# Patient Record
Sex: Male | Born: 1990 | Race: White | Hispanic: No | Smoking: Former smoker
Health system: Southern US, Community
[De-identification: ages and names within clinical notes are randomized; demographics above are authoritative.]

## PROBLEM LIST (undated history)

## (undated) DIAGNOSIS — B019 Varicella without complication: Secondary | ICD-10-CM

## (undated) DIAGNOSIS — J189 Pneumonia, unspecified organism: Secondary | ICD-10-CM

## (undated) DIAGNOSIS — E785 Hyperlipidemia, unspecified: Secondary | ICD-10-CM

## (undated) DIAGNOSIS — K219 Gastro-esophageal reflux disease without esophagitis: Secondary | ICD-10-CM

## (undated) DIAGNOSIS — F419 Anxiety disorder, unspecified: Secondary | ICD-10-CM

## (undated) HISTORY — DX: Anxiety disorder, unspecified: F41.9

## (undated) HISTORY — DX: Gastro-esophageal reflux disease without esophagitis: K21.9

## (undated) HISTORY — DX: Hyperlipidemia, unspecified: E78.5

## (undated) HISTORY — DX: Varicella without complication: B01.9

## (undated) HISTORY — DX: Pneumonia, unspecified organism: J18.9

---
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
## 2015-01-20 ENCOUNTER — Emergency Department: Payer: Self-pay | Admitting: Emergency Medicine

## 2015-07-18 LAB — LIPID PANEL
CHOLESTEROL: 158 mg/dL (ref 0–200)
HDL: 28 mg/dL — AB (ref 35–70)
LDL Cholesterol: 75 mg/dL
TRIGLYCERIDES: 276 mg/dL — AB (ref 40–160)

## 2015-07-18 LAB — HEMOGLOBIN A1C: Hgb A1c MFr Bld: 5.6 % (ref 4.0–6.0)

## 2015-09-04 ENCOUNTER — Ambulatory Visit (INDEPENDENT_AMBULATORY_CARE_PROVIDER_SITE_OTHER): Payer: 59 | Admitting: Family Medicine

## 2015-09-04 ENCOUNTER — Encounter: Payer: Self-pay | Admitting: Family Medicine

## 2015-09-04 VITALS — BP 124/68 | HR 83 | Temp 98.6°F | Ht 70.87 in | Wt 218.2 lb

## 2015-09-04 DIAGNOSIS — E669 Obesity, unspecified: Secondary | ICD-10-CM

## 2015-09-04 DIAGNOSIS — F419 Anxiety disorder, unspecified: Secondary | ICD-10-CM | POA: Diagnosis not present

## 2015-09-04 DIAGNOSIS — Z87891 Personal history of nicotine dependence: Secondary | ICD-10-CM | POA: Insufficient documentation

## 2015-09-04 DIAGNOSIS — E663 Overweight: Secondary | ICD-10-CM | POA: Insufficient documentation

## 2015-09-04 DIAGNOSIS — Z720 Tobacco use: Secondary | ICD-10-CM

## 2015-09-04 NOTE — Progress Notes (Signed)
Patient ID: Billy Acosta, male   DOB: [birth year], 24 y.o.   MRN: [id number]  [name], MD Phone: [phone]  [name] is a 24 y.o. male who presents today for new patient visit.  Obesity: Patient notes he would like to lose weight. Per his work he needs to get below a BMI of 30. He is exercising consistently in the past. States he'll do well for short periods of time and then slack off. He is not exercising at all right now. His diet is poor. SHE does not eat breakfast. He eats lunch out and typically eats [place] or [place] food. Does not eat any snacks during the day. For dinner he has meat and vegetables.  Tobacco abuse: Started smoking 6-7 years ago. Is currently gotten down to 1-2 cigarettes a day. He notes this helps relieve stress. He only smokes after work when he gets home. Since he gets home and goes outside to help him relax. Feels like he is more addicted to the habit of going outside and smoking as opposed to the nicotine itself. He has quit in the past. He has used cigarettes in the past. Tried nicotine gum with no benefit. Notes he does not smoke in the car or around his children.  Anxiety: Patient notes he wants to transfer care from psychiatrist office. He is followed by a counselor as well. He notes he enjoys seeing his counselor and feels counselors helpful. Feels psychiatrist was not helpful. Has been on Zoloft for the last year. He notes this helped. Most of his anxiety is social anxiety. Feels anxious when he goes out of the house. Notes he feels anxious when asked about himself. Has been depressed in the past. Last time was about a year ago. This following separation from his wife. He had some mild SI at that time though no intent or plan. He has no SI or HI now. Additionally he has been on Wellbutrin in the past and this was not helpful.  Active Ambulatory Problems    Diagnosis Date Noted  . Obesity 09/04/2015  . Tobacco abuse 09/04/2015  . Anxiety  09/04/2015   Resolved Ambulatory Problems    Diagnosis Date Noted  . No Resolved Ambulatory Problems   No Additional Past Medical History    No family history on file.  Social History   Social History  . Marital Status: Unknown    Spouse Name: N/A  . Number of Children: N/A  . Years of Education: N/A   Occupational History  . Not on file.   Social History Main Topics  . Smoking status: Current Every Day Smoker  . Smokeless tobacco: Not on file  . Alcohol Use: 0.0 oz/week    0 Standard drinks or equivalent per week  . Drug Use: No  . Sexual Activity: Not on file   Other Topics Concern  . Not on file   Social History Narrative  . No narrative on file    ROS   General:  Negative for unexplained weight loss, fever Skin: Negative for new or changing mole, sore that won't heal HEENT: Negative for trouble hearing, trouble seeing, ringing in ears, mouth sores, hoarseness, change in voice, dysphagia. CV:  Negative for chest pain, dyspnea, edema, palpitations Resp: Negative for cough, dyspnea, hemoptysis GI: Negative for nausea, vomiting, diarrhea, constipation, abdominal pain, melena, hematochezia. GU: Negative for dysuria, incontinence, urinary hesitance, hematuria, vaginal or penile discharge, polyuria, sexual difficulty, lumps in testicle or breasts MSK: Negative for muscle cramps  or aches, joint pain or swelling Neuro: Negative for headaches, weakness, numbness, dizziness, passing out/fainting Psych: Positive for anxiety, Negative for depression, memory problems  Objective  Physical Exam Filed Vitals:   09/04/15 0759  BP: 124/68  Pulse: 83  Temp: 98.6 F (37 C)   Physical Exam  Constitutional: He is well-developed, well-nourished, and in no distress.  HENT:  Head: Normocephalic and atraumatic.  Right Ear: External ear normal.  Left Ear: External ear normal.  Mouth/Throat: Oropharynx is clear and moist. No oropharyngeal exudate.  Eyes: Conjunctivae are  normal. Pupils are equal, round, and reactive to light.  Neck: Neck supple.  Cardiovascular: Normal rate, regular rhythm and normal heart sounds.  Exam reveals no gallop and no friction rub.   No murmur heard. Pulmonary/Chest: Effort normal and breath sounds normal. No respiratory distress. He has no wheezes. He has no rales.  Abdominal: Soft. Bowel sounds are normal. He exhibits no distension. There is no tenderness. There is no rebound and no guarding.  Musculoskeletal: He exhibits no edema.  Lymphadenopathy:    He has no cervical adenopathy.  Neurological: He is alert. Gait normal.  Skin: Skin is warm and dry. He is not diaphoretic.  Psychiatric:  Mood anxious, affect anxious     Assessment/Plan:   Obesity Discussed diet and exercise at length. Patient will start exercising 2-3 days a week. Patient is given dietary instructions and recommendations. We'll see back in a month to see how he is progressed with this.  Tobacco abuse The patient would like tobacco abuse 1-2 cigarettes per day. Discussed options for quitting including nicotine replacement versus habit replacement. Patient opted for habit replacement. Plan of patient going outside to play with his children when he gets home to try to replace the habit of smoking when he goes outside once he gets home. Patient feels this will help him relax once he gets home. He will let us know if this is not beneficial. Patient could benefit from nicotine lozenges in the future if he desires nicotine replacement.  Anxiety Patient opting to transfer care from psychiatrist our office for management of his anxiety medications. States he is stable on Zoloft. We will continue this medication at this time and then request records from his psychiatrist's office. GAD 7 score of 9 with somewhat difficult. PHQ 9 score of 12 with somewhat difficult. Patient will continue to follow up with his counselor as well. No SI or HI at this time. Given return  precautions.   Marikay AlarEric Aydee Mcnew

## 2015-09-04 NOTE — Progress Notes (Signed)
Pre visit review using our clinic review tool, if applicable. No additional management support is needed unless otherwise documented below in the visit note. 

## 2015-09-04 NOTE — Assessment & Plan Note (Signed)
The patient would like tobacco abuse 1-2 cigarettes per day. Discussed options for quitting including nicotine replacement versus habit replacement. Patient opted for habit replacement. Plan of patient going outside to play with his children when he gets home to try to replace the habit of smoking when he goes outside once he gets home. Patient feels this will help him relax once he gets home. He will let us know if this is not beneficial. Patient could benefit from nicotine lozenges in the future if he desires nicotine replacement.

## 2015-09-04 NOTE — Patient Instructions (Addendum)
Nice to meet you. Please add diet changes as outline below.  Please start exercising 2-3 days a week for 30 minutes at a time.  Please attempt to go outside with your kids when you get home to help deter you from smoking.  If you develop thoughts of hurting yourself or others please seek medical attention.   Diet Recommendations  Starchy (carb) foods: Bread, rice, pasta, potatoes, corn, cereal, grits, crackers, bagels, muffins, all baked goods.  (Fruits, milk, and yogurt also have carbohydrate, but most of these foods will not spike your blood sugar as the starchy foods will.)  A few fruits do cause high blood sugars; use small portions of bananas (limit to 1/2 at a time), grapes, watermelon, oranges, and most tropical fruits.    Protein foods: Meat, fish, poultry, eggs, dairy foods, and beans such as pinto and kidney beans (beans also provide carbohydrate).   1. Eat at least 3 meals and 1-2 snacks per day. Never go more than 4-5 hours while awake without eating. Eat breakfast within the first hour of getting up.   2. Limit starchy foods to TWO per meal and ONE per snack. ONE portion of a starchy  food is equal to the following:   - ONE slice of bread (or its equivalent, such as half of a hamburger bun).   - 1/2 cup of a "scoopable" starchy food such as potatoes or rice.   - 15 grams of carbohydrate as shown on food label.  3. Include at every meal: a protein food, a carb food, and vegetables and/or fruit.   - Obtain twice the volume of veg's as protein or carbohydrate foods for both lunch and dinner.   - Fresh or frozen veg's are best.   - Keep frozen veg's on hand for a quick vegetable serving.

## 2015-09-04 NOTE — Assessment & Plan Note (Signed)
Discussed diet and exercise at length. Patient will start exercising 2-3 days a week. Patient is given dietary instructions and recommendations. We'll see back in a month to see how he is progressed with this.

## 2015-09-04 NOTE — Assessment & Plan Note (Signed)
Patient opting to transfer care from psychiatrist our office for management of his anxiety medications. States he is stable on Zoloft. We will continue this medication at this time and then request records from his psychiatrist's office. GAD 7 score of 9 with somewhat difficult. PHQ 9 score of 12 with somewhat difficult. Patient will continue to follow up with his counselor as well. No SI or HI at this time. Given return precautions.

## 2015-10-05 ENCOUNTER — Ambulatory Visit (INDEPENDENT_AMBULATORY_CARE_PROVIDER_SITE_OTHER): Payer: 59 | Admitting: Family Medicine

## 2015-10-05 ENCOUNTER — Encounter: Payer: Self-pay | Admitting: Family Medicine

## 2015-10-05 VITALS — BP 108/70 | HR 66 | Temp 98.1°F | Ht 70.87 in | Wt 221.0 lb

## 2015-10-05 DIAGNOSIS — F419 Anxiety disorder, unspecified: Secondary | ICD-10-CM

## 2015-10-05 DIAGNOSIS — E781 Pure hyperglyceridemia: Secondary | ICD-10-CM | POA: Diagnosis not present

## 2015-10-05 DIAGNOSIS — E669 Obesity, unspecified: Secondary | ICD-10-CM

## 2015-10-05 DIAGNOSIS — Z720 Tobacco use: Secondary | ICD-10-CM

## 2015-10-05 MED ORDER — SERTRALINE HCL 100 MG PO TABS: 100.0000 mg | ORAL_TABLET | Freq: Every day | ORAL | Status: DC

## 2015-10-05 NOTE — Assessment & Plan Note (Signed)
Noted on patient's recent health screen to have triglycerides of 276. Discussed treatment options including diet and exercise versus medication management. Patient prefers to start with diet and exercise. He will work on these as outlined in the obesity section. He is given return precautions. We'll plan to recheck this in 2 months.

## 2015-10-05 NOTE — Patient Instructions (Signed)
Nice to see you. Please start exercising twice a week. She can do whatever exercise she would like. Please take her lunch to work one day a week. Have refilled her Zoloft. If you have any thoughts of depression, worsening anxiety, thoughts of harming yourself or others, or develop chest pain or shortness of breath please seek medical attention immediately.

## 2015-10-05 NOTE — Assessment & Plan Note (Signed)
Has not made any strides regarding exercise. I congratulated him on adding breakfast. We discussed further diet and exercise changes and patient will add 2 days of exercise each week and take his lunch to work one day a week. He will return in 2 months for follow-up on this issue.

## 2015-10-05 NOTE — Assessment & Plan Note (Signed)
Stable. We'll refill his Zoloft today. He'll continue to follow with his counselor. Given return precautions.

## 2015-10-05 NOTE — Progress Notes (Signed)
Patient ID: Billy Acosta, male   DOB: 1991/09/20, 24 y.o.   MRN: [id number]  Billy AlarEric Sonnenberg, MD Phone: [phone]  Billy Acosta is a 24 y.o. male who presents today for follow-up.  Obesity: Patient notes he has not started exercising at all. He does note he has started eating breakfast. He typically will have a protein bar, oatmeal, or cereal. He is still eating lunch out and typically eats [place] or [place] food. He does eat dinner and has a meat and vegetables with this. He does not eat many pastas. He does not drink any sodas or sweet tea. He notes he eats out during the week as a way to get out of the office.  Tobacco abuse: Patient notes he has almost quit smoking. He notes the last time he smoked was over the weekend when he had a couple cigarettes. Prior to that he did not smoke for a week. He is not smoking at all after work now. He does not desire nicotine replacement to help with this.  Anxiety: He has a baseline feeling of anxiety most of the time. It is worse in public places. He notes intermittently feeling depressed, though this is not consistent. He denies SI and HI. He notes the Zoloft has helped significantly since he has started this. He follows with a counselor as well. He notes his anxiety is stable. PHQ 9 score 12 somewhat difficult GAD 7 score 9 somewhat difficult  Hypertriglyceridemia: Patient notes he had a health screening in the end of August revealed triglyceride level of 276. His total cholesterol was 158, his LDL was 75, his HDL was 28, his A1c was 5.6. He denies chest pain and shortness of breath. Has not been on medication for this previously.  PMH: Current smoker, in the process of quitting  ROS see history of present illness  Objective  Physical Exam Filed Vitals:   10/05/15 1356  BP: 108/70  Pulse: 66  Temp: 98.1 F (36.7 C)   Physical Exam  Constitutional: He is well-developed, well-nourished, and in no distress.  HENT:  Head:  Normocephalic and atraumatic.  Cardiovascular: Normal rate, regular rhythm and normal heart sounds.  Exam reveals no gallop and no friction rub.   No murmur heard. Pulmonary/Chest: Effort normal and breath sounds normal. No respiratory distress. He has no wheezes. He has no rales.  Neurological: He is alert. Gait normal.  Skin: Skin is warm and dry. He is not diaphoretic.  Psychiatric:  Mood anxious, affect mildly anxious     Assessment/Plan: Please see individual problem list.  Anxiety Stable. We'll refill his Zoloft today. He'll continue to follow with his counselor. Given return precautions.  Obesity Has not made any strides regarding exercise. I congratulated him on adding breakfast. We discussed further diet and exercise changes and patient will add 2 days of exercise each week and take his lunch to work one day a week. He will return in 2 months for follow-up on this issue.  Tobacco abuse Last cigarette was this past weekend. Congratulated him on not smoking for the past 5 days. He'll continue to work on this. Offered assistance in this, though he prefers to do this on his own.  Hypertriglyceridemia Noted on patient's recent health screen to have triglycerides of 276. Discussed treatment options including diet and exercise versus medication management. Patient prefers to start with diet and exercise. He will work on these as outlined in the obesity section. He is given return precautions. We'll plan to recheck  this in 2 months.    Meds ordered this encounter  Medications  . sertraline (ZOLOFT) 100 MG tablet    Sig: Take 1 tablet (100 mg total) by mouth daily.    Dispense:  90 tablet    Refill:  1    Billy Acosta

## 2015-10-05 NOTE — Assessment & Plan Note (Signed)
Last cigarette was this past weekend. Congratulated him on not smoking for the past 5 days. He'll continue to work on this. Offered assistance in this, though he prefers to do this on his own.

## 2015-10-05 NOTE — Progress Notes (Signed)
Pre visit review using our clinic review tool, if applicable. No additional management support is needed unless otherwise documented below in the visit note. 

## 2015-12-05 ENCOUNTER — Ambulatory Visit: Payer: Self-pay | Admitting: Family Medicine

## 2016-03-29 ENCOUNTER — Other Ambulatory Visit: Payer: Self-pay | Admitting: Family Medicine

## 2016-03-31 NOTE — Telephone Encounter (Signed)
Please advise on refill. Patient was a no show for last appointment

## 2016-03-31 NOTE — Telephone Encounter (Signed)
Refill given. Patient needs follow-up appointment for further refills. 

## 2016-06-19 ENCOUNTER — Other Ambulatory Visit: Payer: Self-pay | Admitting: Family Medicine

## 2016-06-20 NOTE — Telephone Encounter (Signed)
Refill sent to pharmacy. Patient needs follow-up appointment scheduled.

## 2016-06-20 NOTE — Telephone Encounter (Signed)
Can we refill this? Patient No showed last appointment

## 2016-06-26 ENCOUNTER — Ambulatory Visit (INDEPENDENT_AMBULATORY_CARE_PROVIDER_SITE_OTHER): Payer: 59 | Admitting: Family Medicine

## 2016-06-26 DIAGNOSIS — R41840 Attention and concentration deficit: Secondary | ICD-10-CM | POA: Diagnosis not present

## 2016-06-26 NOTE — Progress Notes (Signed)
Pre visit review using our clinic review tool, if applicable. No additional management support is needed unless otherwise documented below in the visit note. 

## 2016-06-26 NOTE — Progress Notes (Signed)
  Billy AlarEric Deannah Rossi, MD Phone: [phone]  Billy Acosta is a 25 y.o. male who presents today for follow-up.  Patient reports in the past he has been diagnosed with ADHD. This occurred when he was high school and states he saw a therapist. Was initially on Vyvanse though switched to Adderall in college. He wants to head back to school and thinks being back on medication will be beneficial. Notes he has been taking and old prescription of Adderall recently and it helped him focus better. Does not remember who did his evaluation. Had no heart rate issues or weight issues with his recent Adderall use. Does have anxiety and this might be slightly worse as he is now in marriage counseling. Still taking Zoloft.  ROS see history of present illness  Objective  Physical Exam Vitals:   06/26/16 1500  BP: 112/76  Pulse: 76  Resp: 16  Temp: 98.8 F (37.1 C)    BP Readings from Last 3 Encounters:  06/26/16 112/76  10/05/15 108/70  09/04/15 124/68   Wt Readings from Last 3 Encounters:  06/26/16 219 lb (99.3 kg)  10/05/15 221 lb (100.2 kg)  09/04/15 218 lb 3.2 oz (99 kg)    Physical Exam  Constitutional: No distress.  HENT:  Head: Normocephalic and atraumatic.  Cardiovascular: Normal rate, regular rhythm and normal heart sounds.   Pulmonary/Chest: Effort normal and breath sounds normal.  Neurological: He is alert. Gait normal.  Skin: He is not diaphoretic.     Assessment/Plan: Please see individual problem list.  Attention deficit Patient reports previous diagnosis of ADHD. We will request records from his pediatrician to see if they have documentation of his evaluation. If no documentation we will refer to a therapist for further evaluation. Once this is completed we will consider medication. He'll continue therapy for his anxiety.   No orders of the defined types were placed in this encounter.    Billy AlarEric Billy Coppa, MD [facility] [place]

## 2016-06-26 NOTE — Patient Instructions (Signed)
Nice to see you. We will request records from your pediatrician's office to see if we can obtain a evaluation for ADHD. If you do not hear anything back from us in the next 2 weeks please give us a call.

## 2016-06-26 NOTE — Assessment & Plan Note (Signed)
Patient reports previous diagnosis of ADHD. We will request records from his pediatrician to see if they have documentation of his evaluation. If no documentation we will refer to a therapist for further evaluation. Once this is completed we will consider medication. He'll continue therapy for his anxiety.

## 2016-07-10 ENCOUNTER — Encounter: Payer: Self-pay | Admitting: Family Medicine

## 2016-07-22 ENCOUNTER — Encounter: Payer: Self-pay | Admitting: Family Medicine

## 2016-08-09 ENCOUNTER — Encounter: Payer: Self-pay | Admitting: Family Medicine

## 2016-08-13 ENCOUNTER — Other Ambulatory Visit: Payer: Self-pay | Admitting: Family Medicine

## 2016-08-13 MED ORDER — AMPHETAMINE-DEXTROAMPHETAMINE 5 MG PO TABS: 5.0000 mg | ORAL_TABLET | Freq: Two times a day (BID) | ORAL | 0 refills | Status: DC

## 2016-08-21 ENCOUNTER — Other Ambulatory Visit: Payer: Self-pay | Admitting: Family Medicine

## 2016-08-22 ENCOUNTER — Telehealth: Payer: Self-pay

## 2016-08-22 NOTE — Telephone Encounter (Signed)
Amphetamine/Dex PA approved through 08/20/2017.

## 2016-09-18 ENCOUNTER — Ambulatory Visit: Payer: Self-pay | Admitting: Family Medicine

## 2016-09-24 ENCOUNTER — Ambulatory Visit (INDEPENDENT_AMBULATORY_CARE_PROVIDER_SITE_OTHER): Payer: 59 | Admitting: Family Medicine

## 2016-09-24 VITALS — BP 104/68 | HR 73 | Temp 98.4°F | Wt 210.6 lb

## 2016-09-24 DIAGNOSIS — Z23 Encounter for immunization: Secondary | ICD-10-CM | POA: Diagnosis not present

## 2016-09-24 DIAGNOSIS — G8929 Other chronic pain: Secondary | ICD-10-CM | POA: Diagnosis not present

## 2016-09-24 DIAGNOSIS — M25512 Pain in left shoulder: Secondary | ICD-10-CM

## 2016-09-24 DIAGNOSIS — M25511 Pain in right shoulder: Secondary | ICD-10-CM

## 2016-09-24 DIAGNOSIS — R41840 Attention and concentration deficit: Secondary | ICD-10-CM

## 2016-09-24 MED ORDER — AMPHETAMINE-DEXTROAMPHETAMINE 10 MG PO TABS: 10.0000 mg | ORAL_TABLET | Freq: Two times a day (BID) | ORAL | 0 refills | Status: DC

## 2016-09-24 NOTE — Progress Notes (Signed)
  Billy AlarEric Korrie Hofbauer, MD Phone: [phone]  Billy Acosta is a 25 y.o. male who presents today for follow-up.  ADD: Patient notes the Adderall has been beneficial. He thinks there is some room to go up and improve on this. No weight loss, appetite suppression, or palpitations.  Patient does note his bilateral shoulders occasionally pop out of place. He notes it is painful when this happens. States it is not just popping, it is actually popping out of place. Notes his brother had similar issues and had surgery on his shoulders. No pain at this time. He has full range of motion.  PMH: Former smoker  ROS see history of present illness  Objective  Physical Exam Vitals:   09/24/16 1506  BP: 104/68  Pulse: 73  Temp: 98.4 F (36.9 C)    BP Readings from Last 3 Encounters:  09/24/16 104/68  06/26/16 112/76  10/05/15 108/70   Wt Readings from Last 3 Encounters:  09/24/16 210 lb 9.6 oz (95.5 kg)  06/26/16 219 lb (99.3 kg)  10/05/15 221 lb (100.2 kg)    Physical Exam  Constitutional: He is well-developed, well-nourished, and in no distress.  Cardiovascular: Normal rate, regular rhythm and normal heart sounds.   Pulmonary/Chest: Effort normal and breath sounds normal.  Musculoskeletal:  Bilateral shoulders are nontender with full active and passive range of motion with no noted abnormalities     Assessment/Plan: Please see individual problem list.  Attention deficit Tolerating Adderall. We will increase to 10 mg twice daily. Follow-up in 3 months.  Chronic pain of both shoulders I am unsure exactly what is happening given his description. It would be odd for him to be repeatedly dislocating his shoulders. We will refer to orthopedic surgery for evaluation.   Orders Placed This Encounter  Procedures  . Flu Vaccine QUAD 36+ mos IM  . Ambulatory referral to Orthopedic Surgery    Referral Priority:   Routine    Referral Type:   Surgical    Referral Reason:   Specialty  Services Required    Requested Specialty:   Orthopedic Surgery    Number of Visits Requested:   1    Meds ordered this encounter  Medications  . amphetamine-dextroamphetamine (ADDERALL) 10 MG tablet    Sig: Take 1 tablet (10 mg total) by mouth 2 (two) times daily. Do not fill until 10/24/16    Dispense:  60 tablet    Refill:  0  . amphetamine-dextroamphetamine (ADDERALL) 10 MG tablet    Sig: Take 1 tablet (10 mg total) by mouth 2 (two) times daily. Do not fill until 11/24/16    Dispense:  60 tablet    Refill:  0  . amphetamine-dextroamphetamine (ADDERALL) 10 MG tablet    Sig: Take 1 tablet (10 mg total) by mouth 2 (two) times daily.    Dispense:  60 tablet    Refill:  0   Billy AlarEric Billy Boomer, MD [facility] [place]

## 2016-09-24 NOTE — Patient Instructions (Signed)
Nice to see you. I have refilled your Adderall. We will refer you to orthopedic surgery.

## 2016-09-24 NOTE — Progress Notes (Signed)
Pre visit review using our clinic review tool, if applicable. No additional management support is needed unless otherwise documented below in the visit note. 

## 2016-09-24 NOTE — Assessment & Plan Note (Signed)
Tolerating Adderall. We will increase to 10 mg twice daily. Follow-up in 3 months.

## 2016-09-24 NOTE — Assessment & Plan Note (Signed)
I am unsure exactly what is happening given his description. It would be odd for him to be repeatedly dislocating his shoulders. We will refer to orthopedic surgery for evaluation.

## 2016-12-02 ENCOUNTER — Other Ambulatory Visit: Payer: Self-pay | Admitting: Family Medicine

## 2016-12-26 ENCOUNTER — Ambulatory Visit: Payer: Self-pay | Admitting: Family Medicine

## 2017-01-28 ENCOUNTER — Ambulatory Visit (INDEPENDENT_AMBULATORY_CARE_PROVIDER_SITE_OTHER): Payer: 59 | Admitting: Family Medicine

## 2017-01-28 ENCOUNTER — Encounter: Payer: Self-pay | Admitting: Family Medicine

## 2017-01-28 VITALS — BP 112/80 | HR 82 | Temp 98.7°F | Wt 216.0 lb

## 2017-01-28 DIAGNOSIS — J309 Allergic rhinitis, unspecified: Secondary | ICD-10-CM | POA: Diagnosis not present

## 2017-01-28 DIAGNOSIS — R41840 Attention and concentration deficit: Secondary | ICD-10-CM

## 2017-01-28 DIAGNOSIS — Z3009 Encounter for other general counseling and advice on contraception: Secondary | ICD-10-CM

## 2017-01-28 DIAGNOSIS — F419 Anxiety disorder, unspecified: Secondary | ICD-10-CM | POA: Diagnosis not present

## 2017-01-28 MED ORDER — AMPHETAMINE-DEXTROAMPHETAMINE 10 MG PO TABS: 10.0000 mg | ORAL_TABLET | Freq: Two times a day (BID) | ORAL | 0 refills | Status: DC

## 2017-01-28 MED ORDER — FLUTICASONE PROPIONATE 50 MCG/ACT NA SUSP: 2.0000 | Freq: Every day | NASAL | 6 refills | Status: DC

## 2017-01-28 NOTE — Progress Notes (Signed)
  Billy AlarEric Teri Legacy, MD Phone: [phone]  [name] is a 26 y.o. male who presents today for follow-up.  ADD: Patient taking Adderall daily. No palpitations, sleep changes, weight changes, or appetite changes. Adderall is very beneficial.  Anxiety: Still some anxiety and minimal depression. No SI. Taking Zoloft. Not seeing his counselor. Notes his symptoms got significantly better with getting a new job.  Allergic rhinitis: Patient wakes up in the morning feeling congested with some rhinorrhea and postnasal drip. This improves during the day. Does use an over-the-counter antiallergy medicine that is nondrowsy.  Patient wants to be evaluated for a vasectomy. He has 3 children. His wife is on board with this.  PMH: Smoker   ROS see history of present illness  Objective  Physical Exam Vitals:   01/28/17 1510  BP: 112/80  Pulse: 82  Temp: 98.7 F (37.1 C)    BP Readings from Last 3 Encounters:  01/28/17 112/80  09/24/16 104/68  06/26/16 112/76   Wt Readings from Last 3 Encounters:  01/28/17 216 lb (98 kg)  09/24/16 210 lb 9.6 oz (95.5 kg)  06/26/16 219 lb (99.3 kg)    Physical Exam  Constitutional: He is well-developed, well-nourished, and in no distress.  Cardiovascular: Normal rate, regular rhythm and normal heart sounds.   Pulmonary/Chest: Effort normal and breath sounds normal.  Musculoskeletal: He exhibits no edema.  Neurological: He is alert. Gait normal.  Skin: Skin is warm and dry.     Assessment/Plan: Please see individual problem list.  Anxiety Improved. Continue Zoloft. Continue to monitor.  Attention deficit Tolerating Adderall. Refills given. Follow-up in 3 months.  Allergic rhinitis Start on Flonase.  Vasectomy evaluation Refer to urology for evaluation and consideration of vasectomy.   Orders Placed This Encounter  Procedures  . Ambulatory referral to Urology    Referral Priority:   Routine    Referral Type:   Consultation   Referral Reason:   Specialty Services Required    Requested Specialty:   Urology    Number of Visits Requested:   1    Meds ordered this encounter  Medications  . amphetamine-dextroamphetamine (ADDERALL) 10 MG tablet    Sig: Take 1 tablet (10 mg total) by mouth 2 (two) times daily. Do not fill until 03/30/17    Dispense:  60 tablet    Refill:  0  . amphetamine-dextroamphetamine (ADDERALL) 10 MG tablet    Sig: Take 1 tablet (10 mg total) by mouth 2 (two) times daily. Do not fill until 02/28/17    Dispense:  60 tablet    Refill:  0  . amphetamine-dextroamphetamine (ADDERALL) 10 MG tablet    Sig: Take 1 tablet (10 mg total) by mouth 2 (two) times daily.    Dispense:  60 tablet    Refill:  0  . fluticasone (FLONASE) 50 MCG/ACT nasal spray    Sig: Place 2 sprays into both nostrils daily.    Dispense:  16 g    Refill:  6    Billy AlarEric Billy Barkan, MD [facility] [place]

## 2017-01-28 NOTE — Progress Notes (Signed)
Pre visit review using our clinic review tool, if applicable. No additional management support is needed unless otherwise documented below in the visit note. 

## 2017-01-28 NOTE — Patient Instructions (Signed)
Nice to see you. We'll refill your Adderall. Please start on the Flonase when you're able. Please monitor your anxiety. We'll get you referred to urology for consideration of a vasectomy.

## 2017-01-28 NOTE — Assessment & Plan Note (Signed)
Refer to urology for evaluation and consideration of vasectomy.

## 2017-01-28 NOTE — Assessment & Plan Note (Signed)
Start on Flonase

## 2017-01-28 NOTE — Assessment & Plan Note (Signed)
Tolerating Adderall. Refills given. Follow-up in 3 months.

## 2017-01-28 NOTE — Assessment & Plan Note (Signed)
Improved. Continue Zoloft. Continue to monitor.

## 2017-02-11 ENCOUNTER — Ambulatory Visit: Payer: Self-pay | Admitting: Urology

## 2017-02-20 NOTE — Progress Notes (Signed)
02/23/2017 2:18 PM   Ross Ludwig July 29, 1991 [id number]  Referring provider: Glori Luis, MD [address] [place], [place] [address]  Chief Complaint  Patient presents with  . New Patient (Initial Visit)    Vas consult referred by Ander Purpura MD    HPI: Mr. BIRD SWETZ is a 26 year old Caucasian male who presents today requesting a vasectomy.  Patient has 2 children with one on the way, who desires no further biological children.  Patient denies any history of chronic prostatitis, epididymitis, orchitis, or other genital pain.  Today, we discussed what the vas deferens is, where it is located, and its function. We reviewed the procedure for vasectomy, it's risks, benefits, alternatives, and likelihood of achieving his goals.   We discussed in detail the procedure, complications, and recovery as well as the need for clearance prior to unprotected intercourse. We discussed that vasectomy does not protect against sexually transmitted diseases. We discussed that this procedure does not result in immediate sterility and that they would need to use other forms of birth control until he has been cleared with a three month negative postvasectomy semen analyses.  I explained that the procedure is considered to be permanent and that attempts at reversal have varying degrees of success. These options include vasectomy reversal, sperm retrieval, and in vitro fertilization; these can be very expensive.   We discussed the chance of postvasectomy pain syndrome which occurs in less than 5% of patients. I explained to the patient that there is no treatment to resolve this chronic pain, and that if it developed I would not be able to help resolve the issue, but that surgery is generally not needed for correction.   I explained there have even been reports of systemic like illness associated with this chronic pain, and that there was no good cure. I explained that  vasectomy it is not a 100% reliable form of birth control, and the risk of pregnancy after vasectomy is approximately 1 in 2000 men who had a negative postvasectomy semen analysis or rare non-motile sperm.  I explained that repeat vasectomy was necessary in less than 1% of vasectomy procedures when employing the type of technique that is performed in the office. I explained that he should refrain from ejaculation for approximately one week following vasectomy. I explained that there are other options for birth control which are permanent and non-permanent; we discussed these.  I explained the rates of surgical complications, such as symptomatic hematoma or infection, are low (1-2%) and vary with the surgeon's experience and criteria used to diagnose the complication.    PMH: Past Medical History:  Diagnosis Date  . Anxiety   . Chicken pox   . GERD (gastroesophageal reflux disease)   . Hyperlipidemia     Surgical History: Past Surgical History:  Procedure Laterality Date  . UPJ obstruction  2004    Home Medications:  Allergies as of 02/23/2017      Reactions   Iodine Other (See Comments)   Amoxicillin Rash      Medication List       Accurate as of 02/23/17  2:18 PM. Always use your most recent med list.          amphetamine-dextroamphetamine 10 MG tablet Commonly known as:  ADDERALL Take 1 tablet (10 mg total) by mouth 2 (two) times daily. Do not fill until 03/30/17   amphetamine-dextroamphetamine 10 MG tablet Commonly known as:  ADDERALL Take 1 tablet (10  mg total) by mouth 2 (two) times daily. Do not fill until 02/28/17   amphetamine-dextroamphetamine 10 MG tablet Commonly known as:  ADDERALL Take 1 tablet (10 mg total) by mouth 2 (two) times daily.   diazepam 10 MG tablet Commonly known as:  VALIUM Take one tablet 30 minutes prior to vasectomy   fluticasone 50 MCG/ACT nasal spray Commonly known as:  FLONASE Place 2 sprays into both nostrils daily.   loratadine 10  MG tablet Commonly known as:  CLARITIN Take 10 mg by mouth daily.   sertraline 100 MG tablet Commonly known as:  ZOLOFT TAKE 1 TABLET BY MOUTH  DAILY       Allergies:  Allergies  Allergen Reactions  . Iodine Other (See Comments)  . Amoxicillin Rash    Family History: Family History  Problem Relation Age of Onset  . Arthritis      grandparent  . Breast cancer      grandmother  . Hyperlipidemia      parent and grandparent  . Stroke      grandparent  . Mental illness      parent, grandparent  . Prostate cancer Neg Hx   . Kidney cancer Neg Hx   . Bladder Cancer Neg Hx     Social History:  reports that he has quit smoking. He has never used smokeless tobacco. He reports that he drinks alcohol. He reports that he does not use drugs.  ROS: UROLOGY Frequent Urination?: No Hard to postpone urination?: No Burning/pain with urination?: No Get up at night to urinate?: No Leakage of urine?: No Urine stream starts and stops?: No Trouble starting stream?: No Do you have to strain to urinate?: No Blood in urine?: No Urinary tract infection?: No Sexually transmitted disease?: No Injury to kidneys or bladder?: No Painful intercourse?: No Weak stream?: No Erection problems?: No Penile pain?: No  Gastrointestinal Nausea?: No Vomiting?: No Indigestion/heartburn?: No Diarrhea?: No Constipation?: No  Constitutional Fever: No Night sweats?: No Weight loss?: No Fatigue?: No  Skin Skin rash/lesions?: No Itching?: No  Eyes Blurred vision?: No Double vision?: No  Ears/Nose/Throat Sore throat?: No Sinus problems?: No  Hematologic/Lymphatic Swollen glands?: No Easy bruising?: No  Cardiovascular Leg swelling?: No Chest pain?: No  Respiratory Cough?: No Shortness of breath?: No  Endocrine Excessive thirst?: No  Musculoskeletal Back pain?: No Joint pain?: No  Neurological Headaches?: No Dizziness?: No  Psychologic Depression?: No Anxiety?:  No  Physical Exam: BP 123/79   Pulse 80   Ht  (1.778 m)   Wt 212 lb 14.4 oz (96.6 kg)   BMI 30.55 kg/m   Constitutional: Well nourished. Alert and oriented, No acute distress. HEENT: [place] AT, moist mucus membranes. Trachea midline, no masses. Cardiovascular: No clubbing, cyanosis, or edema. Respiratory: Normal respiratory effort, no increased work of breathing. GI: Abdomen is soft, non tender, non distended, no abdominal masses. Liver and spleen not palpable.  No hernias appreciated.  Stool sample for occult testing is not indicated.   GU: No CVA tenderness.  No bladder fullness or masses.  Patient with circumcised phallus.   Urethral meatus is patent.  No penile discharge. No penile lesions or rashes. Scrotum without lesions, cysts, rashes and/or edema.  Testicles are located scrotally bilaterally. No masses are appreciated in the testicles. Left and right epididymis are normal. Rectal: Not performed.  Skin: No rashes, bruises or suspicious lesions. Lymph: No cervical or inguinal adenopathy. Neurologic: Grossly intact, no focal deficits, moving all 4 extremities. Psychiatric:  Normal mood and affect.  Laboratory Data: Lab Results  Component Value Date   HGBA1C 5.6 07/18/2015      Component Value Date/Time   CHOL 158 07/18/2015   HDL 28 (A) 07/18/2015   LDLCALC 75 07/18/2015     Assessment & Plan:    1. Vasectomy consult:  Patient has read and signed the consent.  He is given the pre-op vasectomy instruction sheet.  He is prescribed Valium 10 mg and instructed to take it 30 minutes prior to his vasectomy appointment.  He is to have a driver.  I reemphasized to the patient that this is to be considered a permanent form of birth control, that he is to use an alternative form of birth control until we receive the 3 months specimen and it is cleared of sperm and that this will not prevent STI's.  His questions are answered to his satisfaction and he understands the risks and is  willing to proceed with the vasectomy.  He will schedule his vasectomy.    I spent 30 minutes in a face-to-face conversation concerning the vasectomy procedure and pre-and post op expectations.  Greater than 50% was spent in counseling & coordination of care with the patient.   Return for vasectomy.  These notes generated with voice recognition software. I apologize for typographical errors.  Michiel Cowboy, PA-C  [facility] [address] [place], [place] [address] [phone]

## 2017-02-23 ENCOUNTER — Ambulatory Visit (INDEPENDENT_AMBULATORY_CARE_PROVIDER_SITE_OTHER): Payer: 59 | Admitting: Urology

## 2017-02-23 ENCOUNTER — Telehealth: Payer: Self-pay

## 2017-02-23 ENCOUNTER — Encounter: Payer: Self-pay | Admitting: Urology

## 2017-02-23 ENCOUNTER — Other Ambulatory Visit: Payer: Self-pay | Admitting: Family Medicine

## 2017-02-23 VITALS — BP 123/79 | HR 80 | Ht 70.0 in | Wt 212.9 lb

## 2017-02-23 DIAGNOSIS — Z3009 Encounter for other general counseling and advice on contraception: Secondary | ICD-10-CM

## 2017-02-23 MED ORDER — DIAZEPAM 10 MG PO TABS: ORAL_TABLET | ORAL | 0 refills | Status: DC

## 2017-02-23 NOTE — Telephone Encounter (Signed)
PA for Adderall completed on cover my meds

## 2017-02-24 NOTE — Telephone Encounter (Signed)
PA approved from today to 02/23/2018. thanks

## 2017-03-20 ENCOUNTER — Ambulatory Visit: Payer: 59 | Admitting: Urology

## 2017-03-20 ENCOUNTER — Encounter: Payer: Self-pay | Admitting: Urology

## 2017-03-20 VITALS — BP 106/69 | HR 101 | Ht 70.0 in | Wt 214.8 lb

## 2017-03-20 DIAGNOSIS — Z3009 Encounter for other general counseling and advice on contraception: Secondary | ICD-10-CM

## 2017-03-20 MED ORDER — OXYCODONE-ACETAMINOPHEN 5-325 MG PO TABS: 1.0000 | ORAL_TABLET | ORAL | 0 refills | Status: DC | PRN

## 2017-03-20 NOTE — Progress Notes (Signed)
Bilateral Vasectomy Procedure  Pre-Procedure: - Patient's scrotum was prepped and draped for vasectomy. - The vas was palpated through the scrotal skin on the left. - 1% Xylocaine was injected into the skin and surrounding tissue for placement  - In a similar manner, the vas on the right was identified, anesthetized, and stabilized.  Procedure: - A scalpel was used to make a 1 cm incision in his left hemiscrotum - The left vas was isolated and brought up through the incision exposing that structure. - Bleeding points were cauterized as they occurred. - The vas was free from the surrounding structures and brought into view. - A segment was positioned for placement with a hemostat. - A second hemostat was placed and a small segment between the two hemostats and was removed for inspection. - Each end of the transected vas lumen was fulgurated/obliterated using needlepoint electrocautery. It was then tied off with a #2-0 silk suture -The same procedure was performed on the right. - A suture of #3-0 chromic catgut was used to close each lateral scrotal skin incision  Post-Procedure: - Patient was instructed in care of the operative area - A specimen is to be delivered in 12 and 16 weeks   -Another form of contraception is to be used until he is cleared by the office.   

## 2017-05-07 ENCOUNTER — Ambulatory Visit: Payer: Self-pay | Admitting: Family Medicine

## 2017-05-07 DIAGNOSIS — Z0289 Encounter for other administrative examinations: Secondary | ICD-10-CM

## 2017-05-15 ENCOUNTER — Other Ambulatory Visit: Payer: Self-pay | Admitting: Family Medicine

## 2017-07-22 ENCOUNTER — Other Ambulatory Visit: Payer: Self-pay | Admitting: Family Medicine

## 2017-07-22 NOTE — Telephone Encounter (Signed)
Pt called to resch his appt and he did resch the appt but it's not until October and pt needs his amphetamine-dextroamphetamine (ADDERALL) 10 MG tablet to be refilled for 1 month. Please advise?   Call pt @ [phone].

## 2017-07-22 NOTE — Telephone Encounter (Signed)
Last OV 01/28/17 last filled 03/30/17 60 0rf

## 2017-07-27 MED ORDER — AMPHETAMINE-DEXTROAMPHETAMINE 10 MG PO TABS: 10.0000 mg | ORAL_TABLET | Freq: Two times a day (BID) | ORAL | 0 refills | Status: DC

## 2017-07-27 NOTE — Telephone Encounter (Signed)
Please fax

## 2017-07-28 NOTE — Telephone Encounter (Signed)
Left message to notify rx is at front desk 

## 2017-09-08 ENCOUNTER — Ambulatory Visit (INDEPENDENT_AMBULATORY_CARE_PROVIDER_SITE_OTHER): Payer: 59 | Admitting: Family Medicine

## 2017-09-08 ENCOUNTER — Encounter: Payer: Self-pay | Admitting: Family Medicine

## 2017-09-08 VITALS — BP 118/70 | HR 85 | Temp 98.3°F | Resp 12 | Ht 70.0 in | Wt 225.4 lb

## 2017-09-08 DIAGNOSIS — R41840 Attention and concentration deficit: Secondary | ICD-10-CM

## 2017-09-08 DIAGNOSIS — Z23 Encounter for immunization: Secondary | ICD-10-CM | POA: Diagnosis not present

## 2017-09-08 DIAGNOSIS — J309 Allergic rhinitis, unspecified: Secondary | ICD-10-CM

## 2017-09-08 DIAGNOSIS — F419 Anxiety disorder, unspecified: Secondary | ICD-10-CM | POA: Diagnosis not present

## 2017-09-08 DIAGNOSIS — H5702 Anisocoria: Secondary | ICD-10-CM

## 2017-09-08 MED ORDER — AMPHETAMINE-DEXTROAMPHETAMINE 10 MG PO TABS: 10.0000 mg | ORAL_TABLET | Freq: Two times a day (BID) | ORAL | 0 refills | Status: DC

## 2017-09-08 MED ORDER — LORATADINE 10 MG PO TABS: 10.0000 mg | ORAL_TABLET | Freq: Every day | ORAL | 2 refills | Status: DC

## 2017-09-08 MED ORDER — SERTRALINE HCL 100 MG PO TABS: 100.0000 mg | ORAL_TABLET | Freq: Every day | ORAL | 1 refills | Status: DC

## 2017-09-08 NOTE — Assessment & Plan Note (Signed)
Slight worsening recently. He'll continue Zoloft. Continue to monitor.

## 2017-09-08 NOTE — Patient Instructions (Signed)
Nice to see you. We'll get you in to see your eye doctor. If you develop vision changes or eye pain or redness please be evaluated.

## 2017-09-08 NOTE — Progress Notes (Signed)
Marikay AlarEric Emmamae Mcnamara, MD Phone: [phone]  Billy Acosta is a 26 y.o. male who presents today for follow-up.  ADD: Stable on Adderall. Eating half a tablet recently as he is about to run out. Notes this was less helpful than a whole tablet. No appetite changes. No palpitations.  Allergic rhinitis: Taking Claritin. Also taking Flonase. Itchy eyes at times. Rhinorrhea as well.  Anxiety: Notes this is improved though has been somewhat elevated since the birth of his most recent child. No depression. Taking Zoloft which is beneficial.  On exam noted to have anisocoria. Notes no vision changes. Notes he had some eye erythema and itching a week and a half ago. Minimal itching currently. No eye pain. No eye erythema currently. Feels like there is a pressure sensation behind his eye. He has not used eyedrops recently.  PMH: Former smoker   ROS see history of present illness  Objective  Physical Exam Vitals:   09/08/17 1612  BP: 118/70  Pulse: 85  Resp: 12  Temp: 98.3 F (36.8 C)  SpO2: 97%    BP Readings from Last 3 Encounters:  09/08/17 118/70  03/20/17 106/69  02/23/17 123/79   Wt Readings from Last 3 Encounters:  09/08/17 225 lb 6.4 oz (102.2 kg)  03/20/17 214 lb 12.8 oz (97.4 kg)  02/23/17 212 lb 14.4 oz (96.6 kg)    Physical Exam  Constitutional: No distress.  Eyes: Conjunctivae are normal.  Left pupil larger than right pupil on exam, anisocoria is maintained and light and dark  Cardiovascular: Normal rate, regular rhythm and normal heart sounds.   Pulmonary/Chest: Effort normal and breath sounds normal.  Skin: He is not diaphoretic.  CN 2-12 intact, 5/5 strength in bilateral biceps, triceps, grip, quads, hamstrings, plantar and dorsiflexion, sensation to light touch intact in bilateral UE and LE, normal gait   Assessment/Plan: Please see individual problem list.  Allergic rhinitis Refill Claritin. Continue Flonase.  Attention deficit Stable. Refill  Adderall.  Anxiety Slight worsening recently. He'll continue Zoloft. Continue to monitor.  Physiologic anisocoria Physiologic anisocoria based on exam. No other obvious abnormalities on exam. Slight pressure sensation behind left eye. We'll have him evaluated by his eye doctor in the near future. Given return precautions.   Orders Placed This Encounter  Procedures  . Flu Vaccine QUAD 36+ mos IM    Meds ordered this encounter  Medications  . amphetamine-dextroamphetamine (ADDERALL) 10 MG tablet    Sig: Take 1 tablet (10 mg total) by mouth 2 (two) times daily. Do not fill until 11/08/17    Dispense:  60 tablet    Refill:  0  . amphetamine-dextroamphetamine (ADDERALL) 10 MG tablet    Sig: Take 1 tablet (10 mg total) by mouth 2 (two) times daily. Do not fill until 10/09/17    Dispense:  60 tablet    Refill:  0  . amphetamine-dextroamphetamine (ADDERALL) 10 MG tablet    Sig: Take 1 tablet (10 mg total) by mouth 2 (two) times daily.    Dispense:  60 tablet    Refill:  0  . loratadine (CLARITIN) 10 MG tablet    Sig: Take 1 tablet (10 mg total) by mouth daily.    Dispense:  90 tablet    Refill:  2  . sertraline (ZOLOFT) 100 MG tablet    Sig: Take 1 tablet (100 mg total) by mouth daily.    Dispense:  90 tablet    Refill:  1   Marikay AlarEric Devann Cribb, MD [facility]  [employer]

## 2017-09-08 NOTE — Assessment & Plan Note (Signed)
Stable.  Refill Adderall. 

## 2017-09-08 NOTE — Assessment & Plan Note (Signed)
Refill Claritin. Continue Flonase.

## 2017-09-08 NOTE — Assessment & Plan Note (Signed)
Physiologic anisocoria based on exam. No other obvious abnormalities on exam. Slight pressure sensation behind left eye. We'll have him evaluated by his eye doctor in the near future. Given return precautions.

## 2017-10-30 ENCOUNTER — Other Ambulatory Visit: Payer: Self-pay | Admitting: Family Medicine

## 2017-12-15 ENCOUNTER — Ambulatory Visit (INDEPENDENT_AMBULATORY_CARE_PROVIDER_SITE_OTHER): Payer: 59 | Admitting: Family Medicine

## 2017-12-15 ENCOUNTER — Other Ambulatory Visit: Payer: Self-pay

## 2017-12-15 ENCOUNTER — Encounter: Payer: Self-pay | Admitting: Family Medicine

## 2017-12-15 DIAGNOSIS — F419 Anxiety disorder, unspecified: Secondary | ICD-10-CM | POA: Diagnosis not present

## 2017-12-15 DIAGNOSIS — R41840 Attention and concentration deficit: Secondary | ICD-10-CM | POA: Diagnosis not present

## 2017-12-15 DIAGNOSIS — H5702 Anisocoria: Secondary | ICD-10-CM | POA: Diagnosis not present

## 2017-12-15 DIAGNOSIS — Z3009 Encounter for other general counseling and advice on contraception: Secondary | ICD-10-CM | POA: Diagnosis not present

## 2017-12-15 MED ORDER — AMPHETAMINE-DEXTROAMPHETAMINE 10 MG PO TABS: 10.0000 mg | ORAL_TABLET | Freq: Two times a day (BID) | ORAL | 0 refills | Status: DC

## 2017-12-15 MED ORDER — AMPHETAMINE-DEXTROAMPHETAMINE 10 MG PO TABS
10.0000 mg | ORAL_TABLET | Freq: Two times a day (BID) | ORAL | 0 refills | Status: DC
Start: 2017-12-15 — End: 2018-04-16

## 2017-12-15 MED ORDER — SERTRALINE HCL 100 MG PO TABS: 150.0000 mg | ORAL_TABLET | Freq: Every day | ORAL | 1 refills | Status: DC

## 2017-12-15 NOTE — Assessment & Plan Note (Signed)
Relatively stable.  Still has anxiety.  We will increase his Zoloft.  He was given the name of therapists in the area to contact.

## 2017-12-15 NOTE — Patient Instructions (Signed)
Nice to see you. Please contact 1 of the therapist. We will increase your Zoloft.

## 2017-12-15 NOTE — Assessment & Plan Note (Signed)
I encouraged patient to follow-up with urology to complete his semen studies.

## 2017-12-15 NOTE — Assessment & Plan Note (Signed)
Patient saw his eye doctor and was reassured.  He will continue to follow with them.

## 2017-12-15 NOTE — Assessment & Plan Note (Signed)
Continue Adderall.  Refills given. 

## 2017-12-15 NOTE — Progress Notes (Signed)
foll

## 2017-12-15 NOTE — Progress Notes (Signed)
  [name], MD Phone: [phone]  Billy Acosta is a 27 y.o. male who presents today for follow-up.  ADHD: Taking Adderall daily.  Notes this is going well.  No palpitations or appetite suppression.  No sleep changes.  Anxiety: Continues to have some issues with this.  Particularly in social situations.  Sometimes depressed.  No SI.  He is interested in seeing a therapist.  He continues on Zoloft.  He saw optometry for his anisicoria.  They noted it was related to spasm related to him using his glasses for far vision when he was [name] screen.  States evaluation was otherwise unremarkable.  He did have a vasectomy and did not follow-up to complete his semen studies.  Social History   Tobacco Use  Smoking Status Current Every Day Smoker  . Types: E-cigarettes  Smokeless Tobacco Never Used  Tobacco Comment   quit one year     ROS see history of present illness  Objective  Physical Exam Vitals:   12/15/17 1606  BP: 112/80  Pulse: 88  Temp: 98.7 F (37.1 C)  SpO2: 98%    BP Readings from Last 3 Encounters:  12/15/17 112/80  09/08/17 118/70  03/20/17 106/69   Wt Readings from Last 3 Encounters:  12/15/17 220 lb (99.8 kg)  09/08/17 225 lb 6.4 oz (102.2 kg)  03/20/17 214 lb 12.8 oz (97.4 kg)    Physical Exam  Constitutional: No distress.  Eyes:  Slight anisicoria still noted left greater than right eyes  Cardiovascular: Normal rate, regular rhythm and normal heart sounds.  Pulmonary/Chest: Effort normal and breath sounds normal.  Musculoskeletal: He exhibits no edema.  Neurological: He is alert. Gait normal.  Skin: Skin is warm and dry. He is not diaphoretic.     Assessment/Plan: Please see individual problem list.  Vasectomy evaluation I encouraged patient to follow-up with urology to complete his semen studies.  Physiologic anisocoria Patient saw his eye doctor and was reassured.  He will continue to follow with them.  Attention  deficit Continue Adderall.  Refills given.  Anxiety Relatively stable.  Still has anxiety.  We will increase his Zoloft.  He was given the name of therapists in the area to contact.   No orders of the defined types were placed in this encounter.   Meds ordered this encounter  Medications  . amphetamine-dextroamphetamine (ADDERALL) 10 MG tablet    Sig: Take 1 tablet (10 mg total) by mouth 2 (two) times daily. Do not fill until 02/11/18    Dispense:  60 tablet    Refill:  0  . amphetamine-dextroamphetamine (ADDERALL) 10 MG tablet    Sig: Take 1 tablet (10 mg total) by mouth 2 (two) times daily. Do not fill until 01/14/18    Dispense:  60 tablet    Refill:  0  . amphetamine-dextroamphetamine (ADDERALL) 10 MG tablet    Sig: Take 1 tablet (10 mg total) by mouth 2 (two) times daily.    Dispense:  60 tablet    Refill:  0  . sertraline (ZOLOFT) 100 MG tablet    Sig: Take 1.5 tablets (150 mg total) by mouth daily.    Dispense:  135 tablet    Refill:  1     Marikay AlarEric Elajah Kunsman, MD [facility] [place]

## 2018-03-16 ENCOUNTER — Ambulatory Visit: Payer: 59 | Admitting: Family Medicine

## 2018-03-16 DIAGNOSIS — Z0289 Encounter for other administrative examinations: Secondary | ICD-10-CM

## 2018-04-07 ENCOUNTER — Other Ambulatory Visit: Payer: Self-pay | Admitting: Family Medicine

## 2018-04-16 ENCOUNTER — Ambulatory Visit (INDEPENDENT_AMBULATORY_CARE_PROVIDER_SITE_OTHER): Payer: 59 | Admitting: Family Medicine

## 2018-04-16 ENCOUNTER — Encounter: Payer: Self-pay | Admitting: Family Medicine

## 2018-04-16 DIAGNOSIS — H02846 Edema of left eye, unspecified eyelid: Secondary | ICD-10-CM

## 2018-04-16 DIAGNOSIS — H0289 Other specified disorders of eyelid: Secondary | ICD-10-CM

## 2018-04-16 DIAGNOSIS — F419 Anxiety disorder, unspecified: Secondary | ICD-10-CM | POA: Diagnosis not present

## 2018-04-16 DIAGNOSIS — R41840 Attention and concentration deficit: Secondary | ICD-10-CM

## 2018-04-16 MED ORDER — AMPHETAMINE-DEXTROAMPHETAMINE 10 MG PO TABS: 10.0000 mg | ORAL_TABLET | Freq: Two times a day (BID) | ORAL | 0 refills | Status: DC

## 2018-04-16 NOTE — Assessment & Plan Note (Signed)
He will stay on Zoloft 100 mg daily.
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
## 2018-04-16 NOTE — Assessment & Plan Note (Signed)
Continue Adderall.  Refills given. 

## 2018-04-16 NOTE — Patient Instructions (Signed)
Nice to see you. Please watch your left upper eyelid.  I think this will continue to improve.  If you develop vision changes or eye pain please be evaluated. Please monitor your anxiety and if this worsens please let us know.

## 2018-07-21 ENCOUNTER — Ambulatory Visit: Payer: 59 | Admitting: Family Medicine

## 2018-07-21 DIAGNOSIS — Z0289 Encounter for other administrative examinations: Secondary | ICD-10-CM

## 2018-08-16 ENCOUNTER — Telehealth: Payer: Self-pay | Admitting: Family Medicine

## 2018-08-16 ENCOUNTER — Ambulatory Visit: Payer: 59 | Admitting: Family Medicine

## 2018-08-16 ENCOUNTER — Encounter: Payer: Self-pay | Admitting: Family Medicine

## 2018-08-16 ENCOUNTER — Ambulatory Visit (INDEPENDENT_AMBULATORY_CARE_PROVIDER_SITE_OTHER): Payer: Managed Care, Other (non HMO)

## 2018-08-16 VITALS — BP 116/80 | HR 99 | Temp 99.9°F | Ht 70.0 in | Wt 208.6 lb

## 2018-08-16 DIAGNOSIS — R059 Cough, unspecified: Secondary | ICD-10-CM

## 2018-08-16 DIAGNOSIS — R05 Cough: Secondary | ICD-10-CM

## 2018-08-16 DIAGNOSIS — R6889 Other general symptoms and signs: Secondary | ICD-10-CM

## 2018-08-16 DIAGNOSIS — J189 Pneumonia, unspecified organism: Secondary | ICD-10-CM | POA: Insufficient documentation

## 2018-08-16 HISTORY — DX: Pneumonia, unspecified organism: J18.9

## 2018-08-16 LAB — POC INFLUENZA A&B (BINAX/QUICKVUE)
Influenza A, POC: NEGATIVE
Influenza B, POC: NEGATIVE

## 2018-08-16 MED ORDER — AZITHROMYCIN 250 MG PO TABS: ORAL_TABLET | ORAL | 0 refills | Status: DC

## 2018-08-16 NOTE — Progress Notes (Signed)
  [name], MD Phone: [phone]  Billy Acosta is a 27 y.o. male who presents today for f/u.  Flulike symptoms: Patient notes onset of symptoms 4 days ago.  Started with some sore throat and then developed fever, chest congestion, and achiness the next day.  T-max 103 F.  Fevers have improved somewhat over the last couple of days down to 100 F.  Very minimal rhinorrhea.  Minimal sinus congestion if any.  Mostly chest congestion and cough productive of mucus.  No rash.  No travel.  No sick contacts.  No dyspnea.  He has tried Tylenol for his fever.  Social History   Tobacco Use  Smoking Status Current Some Day Smoker  . Types: E-cigarettes  Smokeless Tobacco Never Used     ROS see history of present illness  Objective  Physical Exam Vitals:   08/16/18 1522  BP: 116/80  Pulse: 99  Temp: 99.9 F (37.7 C)  SpO2: 93%    BP Readings from Last 3 Encounters:  08/16/18 116/80  04/16/18 116/72  12/15/17 112/80   Wt Readings from Last 3 Encounters:  08/16/18 208 lb 9.6 oz (94.6 kg)  04/16/18 221 lb (100.2 kg)  12/15/17 220 lb (99.8 kg)    Physical Exam  Constitutional: No distress.  HENT:  Head: Normocephalic and atraumatic.  Mouth/Throat: Oropharynx is clear and moist. No oropharyngeal exudate.  Normal TMs  Eyes: Pupils are equal, round, and reactive to light. Conjunctivae are normal.  Cardiovascular: Normal rate, regular rhythm and normal heart sounds.  Pulmonary/Chest: Effort normal. No stridor. He has no wheezes.  Crackles right lung field greater than left lung field  Musculoskeletal: He exhibits no edema.  Neurological: He is alert.  Skin: Skin is warm and dry. He is not diaphoretic.     Assessment/Plan: Please see individual problem list.  Cough Concern is for pneumonia versus bronchitis.  Rapid flu test was negative.  Lung exam is concerning and thus a chest x-ray will be performed particularly given persistent fevers.  We will start on  azithromycin.  We will contact him with the results and determine follow-up based on chest x-ray results.  Given return precautions.   Orders Placed This Encounter  Procedures  . DG Chest 2 View    Standing Status:   Future    Number of Occurrences:   1    Standing Expiration Date:   10/17/2019    Order Specific Question:   Reason for Exam (SYMPTOM  OR DIAGNOSIS REQUIRED)    Answer:   cough, fever, crackles R>L lung fields    Order Specific Question:   Preferred imaging location?    Answer:   [employer] Specific Question:   Radiology Contrast Protocol - do NOT remove file path    Answer:   \\[email or web address]\epicdata\Radiant\DXFluoroContrastProtocols.pdf  . POC Influenza A&B(BINAX/QUICKVUE)    Meds ordered this encounter  Medications  . azithromycin (ZITHROMAX) 250 MG tablet    Sig: Take 500 mg (2 tablets) by mouth today, then take 250 mg (1 tablet) by mouth daily on days 2 through 5    Dispense:  6 tablet    Refill:  0     [name], MD [facility]

## 2018-08-16 NOTE — Telephone Encounter (Signed)
Spoke with patient.  Informed of his x-ray results.  It reveals pneumonia.  He will continue with the azithromycin.  We will recheck him on Wednesday.  I will have Shelby contact him tomorrow to set up an appointment for 430 on Wednesday.

## 2018-08-16 NOTE — Assessment & Plan Note (Signed)
Concern is for pneumonia versus bronchitis.  Rapid flu test was negative.  Lung exam is concerning and thus a chest x-ray will be performed particularly given persistent fevers.  We will start on azithromycin.  We will contact him with the results and determine follow-up based on chest x-ray results.  Given return precautions.

## 2018-08-16 NOTE — Patient Instructions (Addendum)
Nice to see you. We will treat you with azithromycin for possible bronchitis versus pneumonia.  We will get a chest x-ray and contact you with the results. If you start to feel worse or become short of breath, have cough productive of blood, or fever up to 103-104 F or higher please be reevaluated.

## 2018-08-17 NOTE — Telephone Encounter (Signed)
Patient has been scheduled

## 2018-08-17 NOTE — Telephone Encounter (Signed)
Called and spoke with pt. Pt advised and voiced understanding. I am unable to schedule him for the appt. Pt is aware he will have an appt with Dr. Birdie Sons tomorrow 08/18/2018 at 4:30. Please schedule and send message back to me thanks.

## 2018-08-18 ENCOUNTER — Ambulatory Visit (INDEPENDENT_AMBULATORY_CARE_PROVIDER_SITE_OTHER): Payer: Managed Care, Other (non HMO) | Admitting: Family Medicine

## 2018-08-18 ENCOUNTER — Encounter: Payer: Self-pay | Admitting: Family Medicine

## 2018-08-18 VITALS — BP 102/68 | HR 81 | Temp 98.2°F | Ht 70.0 in | Wt 210.6 lb

## 2018-08-18 DIAGNOSIS — J189 Pneumonia, unspecified organism: Secondary | ICD-10-CM

## 2018-08-18 NOTE — Assessment & Plan Note (Signed)
Clinically patient is improved significantly.  He feels quite a bit better.  He will finish his course of antibiotics.  If his symptoms worsen or do not continue to improve he will be reevaluated.  If he develops recurrent fevers he will be reevaluated.  He will return in 4 weeks for follow-up chest x-ray.  He is given return precautions.

## 2018-08-18 NOTE — Patient Instructions (Signed)
Nice to see you. We will have you return for chest x-ray in 1 month. Please monitor your symptoms and if they do not continue to improve please contact us or be reevaluated.  If your symptoms worsen or you develop fevers again please be reevaluated. Please finish your antibiotics.

## 2018-08-18 NOTE — Progress Notes (Signed)
  [name], MD Phone: [phone]  Billy Acosta is a 27 y.o. male who presents today for f/u.  CC: PNA  Patient was seen 2 days ago for cough and congestion and feeling poorly.  He was found to have a right-sided pneumonia.  He was placed on azithromycin.  He notes he has been improving.  Fever went away the day after he started antibiotics.  No shortness of breath.  Notes his cough is improved.  He can feel " gurgling" in his chest at times.  Sweats at night have been improving.  Overall each day he is feeling better.  He continues on azithromycin.  Social History   Tobacco Use  Smoking Status Current Some Day Smoker  . Types: E-cigarettes  Smokeless Tobacco Never Used     ROS see history of present illness  Objective  Physical Exam Vitals:   08/18/18 1633  BP: 102/68  Pulse: 81  Temp: 98.2 F (36.8 C)  SpO2: 95%    BP Readings from Last 3 Encounters:  08/18/18 102/68  08/16/18 116/80  04/16/18 116/72   Wt Readings from Last 3 Encounters:  08/18/18 210 lb 9.6 oz (95.5 kg)  08/16/18 208 lb 9.6 oz (94.6 kg)  04/16/18 221 lb (100.2 kg)    Physical Exam  Constitutional: No distress.  Cardiovascular: Normal rate, regular rhythm and normal heart sounds.  Pulmonary/Chest: Effort normal.  Crackles right lower lung field, no wheezing  Neurological: He is alert.  Skin: Skin is warm and dry. He is not diaphoretic.     Assessment/Plan: Please see individual problem list.  Community acquired pneumonia Clinically patient is improved significantly.  He feels quite a bit better.  He will finish his course of antibiotics.  If his symptoms worsen or do not continue to improve he will be reevaluated.  If he develops recurrent fevers he will be reevaluated.  He will return in 4 weeks for follow-up chest x-ray.  He is given return precautions.   Orders Placed This Encounter  Procedures  . DG Chest 2 View    Standing Status:   Future    Standing Expiration Date:    10/19/2019    Order Specific Question:   Reason for Exam (SYMPTOM  OR DIAGNOSIS REQUIRED)    Answer:   follow-up pneumonia    Order Specific Question:   Preferred imaging location?    Answer:   [employer] Specific Question:   Radiology Contrast Protocol - do NOT remove file path    Answer:   \\charchive\epicdata\Radiant\DXFluoroContrastProtocols.pdf    No orders of the defined types were placed in this encounter.    [name], MD [facility]

## 2018-08-26 ENCOUNTER — Other Ambulatory Visit: Payer: Self-pay | Admitting: Family Medicine

## 2018-11-18 ENCOUNTER — Other Ambulatory Visit: Payer: Self-pay | Admitting: Family Medicine

## 2018-11-18 MED ORDER — AMPHETAMINE-DEXTROAMPHETAMINE 10 MG PO TABS: 10.0000 mg | ORAL_TABLET | Freq: Two times a day (BID) | ORAL | 0 refills | Status: DC

## 2018-11-18 NOTE — Telephone Encounter (Signed)
Controlled substance database reviewed. Sent to pharmacy.   

## 2018-11-18 NOTE — Telephone Encounter (Signed)
Copied from CRM [id number]. Topic: Quick Communication - Rx Refill/Question >> Nov 18, 2018  1:07 PM Fanny Bien wrote: Medication:amphetamine-dextroamphetamine (ADDERALL) 10 MG tablet [[id number]]   Has the patient contacted their pharmacy? no Preferred Pharmacy (with phone number or street name): [facility] [address], [place] - [address] [phone] (Phone) [phone] (Fax)    Agent: Please be advised that RX refills may take up to 3 business days. We ask that you follow-up with your pharmacy.

## 2018-12-21 ENCOUNTER — Ambulatory Visit (INDEPENDENT_AMBULATORY_CARE_PROVIDER_SITE_OTHER): Payer: Managed Care, Other (non HMO) | Admitting: Family Medicine

## 2018-12-21 ENCOUNTER — Encounter: Payer: Self-pay | Admitting: Family Medicine

## 2018-12-21 VITALS — BP 120/68 | HR 81 | Temp 98.2°F | Ht 70.0 in | Wt 221.2 lb

## 2018-12-21 DIAGNOSIS — E781 Pure hyperglyceridemia: Secondary | ICD-10-CM | POA: Diagnosis not present

## 2018-12-21 DIAGNOSIS — Z23 Encounter for immunization: Secondary | ICD-10-CM | POA: Diagnosis not present

## 2018-12-21 DIAGNOSIS — R41840 Attention and concentration deficit: Secondary | ICD-10-CM | POA: Diagnosis not present

## 2018-12-21 DIAGNOSIS — F419 Anxiety disorder, unspecified: Secondary | ICD-10-CM | POA: Diagnosis not present

## 2018-12-21 LAB — COMPREHENSIVE METABOLIC PANEL
ALK PHOS: 71 U/L (ref 39–117)
ALT: 14 U/L (ref 0–53)
AST: 16 U/L (ref 0–37)
Albumin: 4.7 g/dL (ref 3.5–5.2)
BILIRUBIN TOTAL: 0.3 mg/dL (ref 0.2–1.2)
BUN: 17 mg/dL (ref 6–23)
CALCIUM: 10 mg/dL (ref 8.4–10.5)
CO2: 29 mEq/L (ref 19–32)
CREATININE: 0.9 mg/dL (ref 0.40–1.50)
Chloride: 104 mEq/L (ref 96–112)
GFR: 99.67 mL/min (ref >60.00)
Glucose, Bld: 100 mg/dL — ABNORMAL HIGH (ref 70–99)
Potassium: 4.7 mEq/L (ref 3.5–5.1)
Sodium: 140 mEq/L (ref 135–145)
Total Protein: 7.7 g/dL (ref 6.0–8.3)

## 2018-12-21 LAB — LIPID PANEL
CHOLESTEROL: 158 mg/dL (ref 0–200)
HDL: 28.5 mg/dL — AB (ref >39.00)
LDL Cholesterol: 91 mg/dL (ref 0–99)
NonHDL: 129.18
Total CHOL/HDL Ratio: 6
Triglycerides: 191 mg/dL — ABNORMAL HIGH (ref 0.0–149.0)
VLDL: 38.2 mg/dL (ref 0.0–40.0)

## 2018-12-21 LAB — HEMOGLOBIN A1C: Hgb A1c MFr Bld: 5.6 % (ref 4.6–6.5)

## 2018-12-21 MED ORDER — AMPHETAMINE-DEXTROAMPHETAMINE 10 MG PO TABS: 20.0000 mg | ORAL_TABLET | Freq: Two times a day (BID) | ORAL | 0 refills | Status: DC

## 2018-12-21 MED ORDER — SERTRALINE HCL 100 MG PO TABS: 100.0000 mg | ORAL_TABLET | Freq: Every day | ORAL | 3 refills | Status: DC

## 2018-12-21 MED ORDER — AMPHETAMINE-DEXTROAMPHETAMINE 10 MG PO TABS: 10.0000 mg | ORAL_TABLET | Freq: Two times a day (BID) | ORAL | 0 refills | Status: DC

## 2018-12-21 NOTE — Assessment & Plan Note (Addendum)
Fairly well controlled.  Continue current medication.  Refer for therapy.

## 2018-12-21 NOTE — Progress Notes (Signed)
Tommi Rumps, MD Phone: [phone]  Billy Acosta is a 28 y.o. male who presents today for f/u.  CC: Anxiety, ADHD, hypertriglyceridemia  Anxiety: Notes it is there though is more manageable than previously.  Notes its mostly social situations and leaving his comfortable spaces.  In the past therapy has been beneficial.  Taking Zoloft.  Occasional depression that is related to specific occurrences.  Nothing consistent.  No SI.  ADHD: Taking Adderall which is quite beneficial.  No sleep changes, palpitations, or appetite changes.  Hypertriglyceridemia: No chest pain or claudication.  Notes he has made some lifestyle changes and is eating more fresh foods.  He is cut down on sugar and processed foods.  He has been doing yoga for exercise and really likes this.  Social History   Tobacco Use  Smoking Status Current Some Day Smoker  . Types: E-cigarettes  Smokeless Tobacco Never Used     ROS see history of present illness  Objective  Physical Exam Vitals:   12/21/18 0953  BP: 120/68  Pulse: 81  Temp: 98.2 F (36.8 C)  SpO2: 98%    BP Readings from Last 3 Encounters:  12/21/18 120/68  08/18/18 102/68  08/16/18 116/80   Wt Readings from Last 3 Encounters:  12/21/18 221 lb 3.2 oz (100.3 kg)  08/18/18 210 lb 9.6 oz (95.5 kg)  08/16/18 208 lb 9.6 oz (94.6 kg)    Physical Exam Constitutional:      General: He is not in acute distress.    Appearance: He is not diaphoretic.  Cardiovascular:     Rate and Rhythm: Normal rate and regular rhythm.     Heart sounds: Normal heart sounds.  Pulmonary:     Effort: Pulmonary effort is normal.     Breath sounds: Normal breath sounds.  Musculoskeletal:     Right lower leg: No edema.     Left lower leg: No edema.  Skin:    General: Skin is warm and dry.  Neurological:     Mental Status: He is alert.      Assessment/Plan: Please see individual problem list.  Anxiety Fairly well controlled.  Continue current  medication.  Refer for therapy.  Hypertriglyceridemia Continue diet and exercise.  We will check labs today.  Attention deficit Continue Adderall.   Health Maintenance: Tdap given today.  Orders Placed This Encounter  Procedures  . Tdap vaccine greater than or equal to 7yo IM  . Lipid panel  . HgB A1c  . Comp Met (CMET)  . Ambulatory referral to Psychology    Referral Priority:   Routine    Referral Type:   Psychiatric    Referral Reason:   Specialty Services Required    Requested Specialty:   Psychology    Number of Visits Requested:   1    Meds ordered this encounter  Medications  . amphetamine-dextroamphetamine (ADDERALL) 10 MG tablet    Sig: Take 1 tablet (10 mg total) by mouth 2 (two) times daily.    Dispense:  60 tablet    Refill:  0  . amphetamine-dextroamphetamine (ADDERALL) 10 MG tablet    Sig: Take 1 tablet (10 mg total) by mouth 2 (two) times daily.    Dispense:  60 tablet    Refill:  0  . sertraline (ZOLOFT) 100 MG tablet    Sig: Take 1 tablet (100 mg total) by mouth daily.    Dispense:  90 tablet    Refill:  3  . amphetamine-dextroamphetamine (ADDERALL) 10  MG tablet    Sig: Take 2 tablets (20 mg total) by mouth 2 (two) times daily.    Dispense:  60 tablet    Refill:  0     Tommi Rumps, MD [place]

## 2018-12-21 NOTE — Assessment & Plan Note (Signed)
Continue Adderall 

## 2018-12-21 NOTE — Assessment & Plan Note (Signed)
Continue diet and exercise.  We will check labs today.

## 2018-12-21 NOTE — Patient Instructions (Signed)
Nice to see you. We will have you see the therapist. We will get lab work today and contact you with the results. Please continue with diet and exercise changes.  I like that you are making lifestyle changes as opposed to dieting.

## 2018-12-31 ENCOUNTER — Telehealth: Payer: Self-pay | Admitting: Family Medicine

## 2018-12-31 MED ORDER — OSELTAMIVIR PHOSPHATE 75 MG PO CAPS: 75.0000 mg | ORAL_CAPSULE | Freq: Every day | ORAL | 0 refills | Status: DC

## 2018-12-31 NOTE — Telephone Encounter (Signed)
Pt requesting prophylactic tamaflu several family members have flu.

## 2018-12-31 NOTE — Telephone Encounter (Signed)
Tamiflu sent to pharmacy.  Please let the patient know that there are some risks to this medication including possible confusion, delirium, and hallucinations.  If he develops his symptoms he needs to be evaluated and should discontinue the medication.  If he develops symptoms he needs to contact us as the directions for Tamiflu use would change.

## 2018-12-31 NOTE — Telephone Encounter (Signed)
Called pt and left a detailed VM. Advised pt to call back if needed. Will try to call again on Monday to make sure he did get the message.

## 2018-12-31 NOTE — Telephone Encounter (Signed)
Copied from CRM [id number]. Topic: Quick Communication - See Telephone Encounter >> Dec 31, 2018  8:33 AM Jens Som A wrote: CRM for notification. See Telephone encounter for: 12/31/18.  Patient is calling because 3 out of 5 people in his family have Flu B. Patient is requesting tamaflu or anything preventative to assist him from getting the flu. Please advise [phone]

## 2018-12-31 NOTE — Telephone Encounter (Signed)
Please find out if he has any flu symptoms. I can the send in appropriate treatment.

## 2018-12-31 NOTE — Telephone Encounter (Signed)
Pt is not having any symptoms. Two weeks ago his son was Dx with positive flu B. Daughter and wife now Dx with Flu B as well. Just concern since he is in a house hold with three ppl who had or have the flu. Plus he has to travel for work in two weeks and would really like to avoid getting sick.   [facility] [address]

## 2019-01-03 NOTE — Telephone Encounter (Signed)
Called pt and left a VM to call back.  

## 2019-01-03 NOTE — Telephone Encounter (Signed)
Called and spoke with patient. Pt advised and voiced understanding.  

## 2019-02-17 ENCOUNTER — Telehealth: Payer: Self-pay | Admitting: Family Medicine

## 2019-02-17 DIAGNOSIS — R41840 Attention and concentration deficit: Secondary | ICD-10-CM

## 2019-02-17 NOTE — Telephone Encounter (Signed)
Copied from CRM [id number]. Topic: General - Other >> Feb 17, 2019  3:05 PM Tamela Oddi wrote: Reason for CRM: [facility] called to get verification on the patients medication, amphetamine-dextroamphetamine (ADDERALL) 10 MG tablet.  She stated that the directions need clarification.  Please advise and call to clarify.  CB# [phone]

## 2019-02-18 MED ORDER — AMPHETAMINE-DEXTROAMPHETAMINE 10 MG PO TABS: 10.0000 mg | ORAL_TABLET | Freq: Two times a day (BID) | ORAL | 0 refills | Status: DC

## 2019-02-18 NOTE — Telephone Encounter (Signed)
Called pt and left a VM to call back on my direct line at [phone]

## 2019-02-18 NOTE — Telephone Encounter (Signed)
Sent to PCP to advise should patient be taking 2 tablets twice daily? Total of 4 tablets?

## 2019-02-18 NOTE — Telephone Encounter (Signed)
Sent to pharmacy 

## 2019-02-18 NOTE — Telephone Encounter (Signed)
Please check with the patient to see what dose he is currently taking. There are 2 different prescriptions in the system.

## 2019-02-18 NOTE — Telephone Encounter (Signed)
Called and spoke with pt. Pt is taking 10 mg 1 tablet bid. Pt is not taking 2 tablets bid.   [facility] to clarify error on [facility] end when sending Rx. The pharmacy has removed the prescription for 02/19/2019 they will need a new prescription sent in since they can not alter controlled prescriptions.   Sent to PCP resend Adderall 10 MG 1 table BID

## 2019-03-23 ENCOUNTER — Other Ambulatory Visit: Payer: Self-pay | Admitting: Family Medicine

## 2019-03-23 DIAGNOSIS — R41840 Attention and concentration deficit: Secondary | ICD-10-CM

## 2019-03-23 NOTE — Telephone Encounter (Signed)
Last OV 12/21/2018  Last refilled  amphetamine-dextroamphetamine (ADDERALL) 10 MG tablet 60 tablet 0 02/19/2019     Next OV none scheduled   Sent to PCP

## 2019-03-24 ENCOUNTER — Other Ambulatory Visit: Payer: Self-pay | Admitting: Lab

## 2019-03-24 DIAGNOSIS — R41840 Attention and concentration deficit: Secondary | ICD-10-CM

## 2019-03-25 NOTE — Telephone Encounter (Signed)
Please get the patient scheduled for a follow-up visit. He will need to be scheduled to receive a refill. Thanks.

## 2019-03-29 ENCOUNTER — Ambulatory Visit: Payer: Managed Care, Other (non HMO) | Admitting: Family Medicine

## 2019-03-29 ENCOUNTER — Encounter: Payer: Self-pay | Admitting: Family Medicine

## 2019-03-29 ENCOUNTER — Other Ambulatory Visit: Payer: Self-pay | Admitting: Family Medicine

## 2019-03-29 DIAGNOSIS — R41840 Attention and concentration deficit: Secondary | ICD-10-CM

## 2019-03-31 MED ORDER — AMPHETAMINE-DEXTROAMPHETAMINE 10 MG PO TABS: 10.0000 mg | ORAL_TABLET | Freq: Two times a day (BID) | ORAL | 0 refills | Status: DC

## 2019-03-31 NOTE — Telephone Encounter (Signed)
Please call the patient and let him know that I would like to do a virtual visit to follow-up on his ADHD. I will refill his medication for one month, though will refill for future months once we are able to complete the virtual visit. Thanks.

## 2019-04-01 ENCOUNTER — Other Ambulatory Visit: Payer: Self-pay

## 2019-04-01 NOTE — Telephone Encounter (Signed)
I called the patient to schedule a virtual visit with the provider and the mailbox is full, pt cannot get a refill on adderall until he is seen.  Reid Nawrot,cma

## 2019-05-17 ENCOUNTER — Other Ambulatory Visit: Payer: Self-pay

## 2019-05-17 ENCOUNTER — Ambulatory Visit (INDEPENDENT_AMBULATORY_CARE_PROVIDER_SITE_OTHER): Payer: Managed Care, Other (non HMO) | Admitting: Family Medicine

## 2019-05-17 ENCOUNTER — Encounter: Payer: Self-pay | Admitting: Family Medicine

## 2019-05-17 DIAGNOSIS — E781 Pure hyperglyceridemia: Secondary | ICD-10-CM

## 2019-05-17 DIAGNOSIS — R41840 Attention and concentration deficit: Secondary | ICD-10-CM | POA: Diagnosis not present

## 2019-05-17 DIAGNOSIS — F419 Anxiety disorder, unspecified: Secondary | ICD-10-CM | POA: Diagnosis not present

## 2019-05-17 MED ORDER — AMPHETAMINE-DEXTROAMPHETAMINE 10 MG PO TABS: 10.0000 mg | ORAL_TABLET | Freq: Two times a day (BID) | ORAL | 0 refills | Status: DC

## 2019-05-17 NOTE — Assessment & Plan Note (Signed)
Encouraged continued exercise and dietary changes. ?

## 2019-05-17 NOTE — Assessment & Plan Note (Signed)
Well controlled. Continue current medication.  

## 2019-05-17 NOTE — Assessment & Plan Note (Signed)
Well-controlled.  Continue Adderall.  Controlled substance database reviewed.  Sent to pharmacy.

## 2019-05-17 NOTE — Progress Notes (Signed)
Virtual Visit via video Note  This visit type was conducted due to national recommendations for restrictions regarding the COVID-19 pandemic (e.g. social distancing).  This format is felt to be most appropriate for this patient at this time.  All issues noted in this document were discussed and addressed.  No physical exam was performed (except for noted visual exam findings with Video Visits).   I connected with Billy DinningAndrew Acosta today at  8:30 AM EDT by a video enabled telemedicine application and verified that I am speaking with the correct person using two identifiers. Location patient: home Location provider: work Persons participating in the virtual visit: patient, provider  I discussed the limitations, risks, security and privacy concerns of performing an evaluation and management service by telephone and the availability of in person appointments. I also discussed with the patient that there may be a patient responsible charge related to this service. The patient expressed understanding and agreed to proceed.   Reason for visit: Follow-up.  HPI: ADHD: Taking Adderall.  Notes he has been benefiting from it more since he has been on working.  No appetite suppression.  No sleep changes.  No palpitations.  Anxiety: Patient notes this is mild and manageable.  He is on Zoloft which has been quite beneficial.  No depression.  Hypertriglyceridemia: Mildly elevated triglycerides previously.  He notes no chest pain.  He has been doing home renovations because they have been stuck at home.  He is also staying active and doing yoga 1-2 times a week.  He does note he was eating more initially during the COVID pandemic though has more recently decreased his intake.  He is eating a normal diet.  They are eating out less.   ROS: See pertinent positives and negatives per HPI.  Past Medical History:  Diagnosis Date  . Anxiety   . Chicken pox   . Community acquired pneumonia 08/16/2018  . GERD  (gastroesophageal reflux disease)   . Hyperlipidemia     Past Surgical History:  Procedure Laterality Date  . UPJ obstruction  2004    Family History  Problem Relation Age of Onset  . Arthritis Unknown        grandparent  . Breast cancer Unknown        grandmother  . Hyperlipidemia Unknown        parent and grandparent  . Stroke Unknown        grandparent  . Mental illness Unknown        parent, grandparent  . Prostate cancer Neg Hx   . Kidney cancer Neg Hx   . Bladder Cancer Neg Hx     SOCIAL HX: Current smoker   Current Outpatient Medications:  .  [START ON 07/17/2019] amphetamine-dextroamphetamine (ADDERALL) 10 MG tablet, Take 1 tablet (10 mg total) by mouth 2 (two) times daily., Disp: 60 tablet, Rfl: 0 .  [START ON 06/16/2019] amphetamine-dextroamphetamine (ADDERALL) 10 MG tablet, Take 1 tablet (10 mg total) by mouth 2 (two) times daily., Disp: 60 tablet, Rfl: 0 .  amphetamine-dextroamphetamine (ADDERALL) 10 MG tablet, Take 1 tablet (10 mg total) by mouth 2 (two) times daily., Disp: 60 tablet, Rfl: 0 .  cetirizine (ZYRTEC) 10 MG tablet, Take 10 mg by mouth daily., Disp: , Rfl:  .  fluticasone (FLONASE) 50 MCG/ACT nasal spray, Place 2 sprays into both nostrils daily., Disp: 16 g, Rfl: 6 .  sertraline (ZOLOFT) 100 MG tablet, Take 1 tablet (100 mg total) by mouth daily., Disp: 90 tablet, Rfl:  3  EXAM:  VITALS per patient if applicable: None.  GENERAL: alert, oriented, appears well and in no acute distress  HEENT: atraumatic, conjunttiva clear, no obvious abnormalities on inspection of external nose and ears  NECK: normal movements of the head and neck  LUNGS: on inspection no signs of respiratory distress, breathing rate appears normal, no obvious gross SOB, gasping or wheezing  CV: no obvious cyanosis  MS: moves all visible extremities without noticeable abnormality  PSYCH/NEURO: pleasant and cooperative, no obvious depression or anxiety, speech and thought  processing grossly intact  ASSESSMENT AND PLAN:  Discussed the following assessment and plan:  Anxiety Well-controlled.  Continue current medication.  Attention deficit Well-controlled.  Continue Adderall.  Controlled substance database reviewed.  Sent to pharmacy.  Hypertriglyceridemia Encouraged continued exercise and dietary changes.  Social distancing precautions and sick precautions given regarding COVID-19.   I discussed the assessment and treatment plan with the patient. The patient was provided an opportunity to ask questions and all were answered. The patient agreed with the plan and demonstrated an understanding of the instructions.   The patient was advised to call back or seek an in-person evaluation if the symptoms worsen or if the condition fails to improve as anticipated.   Tommi Rumps, MD

## 2019-08-24 ENCOUNTER — Encounter: Payer: Self-pay | Admitting: Family Medicine

## 2019-08-24 ENCOUNTER — Ambulatory Visit (INDEPENDENT_AMBULATORY_CARE_PROVIDER_SITE_OTHER): Payer: Managed Care, Other (non HMO) | Admitting: Family Medicine

## 2019-08-24 ENCOUNTER — Other Ambulatory Visit: Payer: Self-pay

## 2019-08-24 DIAGNOSIS — Z6828 Body mass index (BMI) 28.0-28.9, adult: Secondary | ICD-10-CM | POA: Diagnosis not present

## 2019-08-24 DIAGNOSIS — F419 Anxiety disorder, unspecified: Secondary | ICD-10-CM | POA: Diagnosis not present

## 2019-08-24 DIAGNOSIS — R41840 Attention and concentration deficit: Secondary | ICD-10-CM | POA: Diagnosis not present

## 2019-08-24 DIAGNOSIS — E663 Overweight: Secondary | ICD-10-CM

## 2019-08-24 DIAGNOSIS — Z720 Tobacco use: Secondary | ICD-10-CM | POA: Diagnosis not present

## 2019-08-24 MED ORDER — AMPHETAMINE-DEXTROAMPHETAMINE 12.5 MG PO TABS: 12.5000 mg | ORAL_TABLET | Freq: Two times a day (BID) | ORAL | 0 refills | Status: DC

## 2019-08-24 NOTE — Assessment & Plan Note (Signed)
Patient's weight has trended down.  Discussed continuing with dietary changes.  Advised to add in some specific exercise.

## 2019-08-24 NOTE — Assessment & Plan Note (Signed)
Smoking cessation counseling was provided.  Approximately 4 minutes were spent discussing the rationale for tobacco cessation and strategies for doing so.  Adjuncts, including nicotine patches and nicotine lozenges were recommended. F/u in one month.

## 2019-08-24 NOTE — Assessment & Plan Note (Signed)
We will try slightly higher dose of his Adderall.  He will monitor for side effects.  We will follow-up in 1 month.

## 2019-08-24 NOTE — Progress Notes (Signed)
Virtual Visit via video Note  This visit type was conducted due to national recommendations for restrictions regarding the COVID-19 pandemic (e.g. social distancing).  This format is felt to be most appropriate for this patient at this time.  All issues noted in this document were discussed and addressed.  No physical exam was performed (except for noted visual exam findings with Video Visits).   I connected with Billy Acosta today at  9:00 AM EDT by a video enabled telemedicine application and verified that I am speaking with the correct person using two identifiers. Location patient: home Location provider: work  Persons participating in the virtual visit: patient, provider  I discussed the limitations, risks, security and privacy concerns of performing an evaluation and management service by telephone and the availability of in person appointments. I also discussed with the patient that there may be a patient responsible charge related to this service. The patient expressed understanding and agreed to proceed.   Reason for visit: follow-up  HPI: Anxiety: Patient notes he is having trouble focusing with his ADHD and that is causing some anxiety though nothing significant.  He notes no depression.  He is on Zoloft.  ADHD: Patient notes over the last month or so he feels as though the Adderall has not been working as well.  He has multiple things that he has to do with work and he has trouble focusing on 1 of them at a time and he will start thinking about all of them.  He notes no appetite suppression, sleep changes, or palpitations.  Overweight: Patient notes his weight has trended down and he got down to 198 though today was 200.  He is actively eating better.  He is eating smaller lunches.  He has been doing renovations at home that is been keeping him active though no specific exercise.  Tobacco abuse: Patient continues to smoke 2 to 3 cigarettes a day.  Most of it is a habit at this  point and a way for him to relax after work and before bed.  He tried e-cigarettes previously though he notes that is just replacing the habit with another habit.  He is tried gums and lozenges as well.  He is contemplating quitting.   ROS: See pertinent positives and negatives per HPI.  Past Medical History:  Diagnosis Date  . Anxiety   . Chicken pox   . Community acquired pneumonia 08/16/2018  . GERD (gastroesophageal reflux disease)   . Hyperlipidemia     Past Surgical History:  Procedure Laterality Date  . UPJ obstruction  2004    Family History  Problem Relation Age of Onset  . Arthritis Unknown        grandparent  . Breast cancer Unknown        grandmother  . Hyperlipidemia Unknown        parent and grandparent  . Stroke Unknown        grandparent  . Mental illness Unknown        parent, grandparent  . Prostate cancer Neg Hx   . Kidney cancer Neg Hx   . Bladder Cancer Neg Hx     SOCIAL HX: Current smoker   Current Outpatient Medications:  .  amphetamine-dextroamphetamine (ADDERALL) 12.5 MG tablet, Take 1 tablet by mouth 2 (two) times daily., Disp: 60 tablet, Rfl: 0 .  cetirizine (ZYRTEC) 10 MG tablet, Take 10 mg by mouth daily., Disp: , Rfl:  .  fluticasone (FLONASE) 50 MCG/ACT nasal spray, Place  2 sprays into both nostrils daily., Disp: 16 g, Rfl: 6 .  sertraline (ZOLOFT) 100 MG tablet, Take 1 tablet (100 mg total) by mouth daily., Disp: 90 tablet, Rfl: 3  EXAM:  VITALS per patient if applicable: None.  GENERAL: alert, oriented, appears well and in no acute distress  HEENT: atraumatic, conjunttiva clear, no obvious abnormalities on inspection of external nose and ears  NECK: normal movements of the head and neck  LUNGS: on inspection no signs of respiratory distress, breathing rate appears normal, no obvious gross SOB, gasping or wheezing  CV: no obvious cyanosis  MS: moves all visible extremities without noticeable abnormality  PSYCH/NEURO: pleasant  and cooperative, no obvious depression or anxiety, speech and thought processing grossly intact  ASSESSMENT AND PLAN:  Discussed the following assessment and plan:  Anxiety Relatively well controlled.  If not improving with adequate treatment of his ADHD he will let us know.  Attention deficit We will try slightly higher dose of his Adderall.  He will monitor for side effects.  We will follow-up in 1 month.  Overweight (BMI 25.0-29.9) Patient's weight has trended down.  Discussed continuing with dietary changes.  Advised to add in some specific exercise.  Tobacco abuse Smoking cessation counseling was provided.  Approximately 4 minutes were spent discussing the rationale for tobacco cessation and strategies for doing so.  Adjuncts, including nicotine patches and nicotine lozenges were recommended. F/u in one month.      I discussed the assessment and treatment plan with the patient. The patient was provided an opportunity to ask questions and all were answered. The patient agreed with the plan and demonstrated an understanding of the instructions.   The patient was advised to call back or seek an in-person evaluation if the symptoms worsen or if the condition fails to improve as anticipated.   [name], MD

## 2019-08-24 NOTE — Assessment & Plan Note (Signed)
Relatively well controlled.  If not improving with adequate treatment of his ADHD he will let us know.

## 2019-09-23 ENCOUNTER — Telehealth: Payer: Self-pay | Admitting: Family Medicine

## 2019-09-23 NOTE — Telephone Encounter (Signed)
I called pt twice and left msg to call ofc. °

## 2019-09-26 ENCOUNTER — Other Ambulatory Visit: Payer: Self-pay

## 2019-09-26 ENCOUNTER — Encounter: Payer: Self-pay | Admitting: Family Medicine

## 2019-09-26 ENCOUNTER — Ambulatory Visit (INDEPENDENT_AMBULATORY_CARE_PROVIDER_SITE_OTHER): Payer: Managed Care, Other (non HMO) | Admitting: Family Medicine

## 2019-09-26 DIAGNOSIS — J309 Allergic rhinitis, unspecified: Secondary | ICD-10-CM | POA: Diagnosis not present

## 2019-09-26 DIAGNOSIS — R41840 Attention and concentration deficit: Secondary | ICD-10-CM | POA: Diagnosis not present

## 2019-09-26 MED ORDER — AMPHETAMINE-DEXTROAMPHETAMINE 12.5 MG PO TABS: 12.5000 mg | ORAL_TABLET | Freq: Two times a day (BID) | ORAL | 0 refills | Status: DC

## 2019-09-26 MED ORDER — FLUTICASONE PROPIONATE 50 MCG/ACT NA SUSP: 2.0000 | Freq: Every day | NASAL | 6 refills | Status: DC

## 2019-09-26 NOTE — Assessment & Plan Note (Addendum)
I suspect eustachian tube dysfunction related to allergies.  He will add in Flonase.  He will continue Zyrtec.  If not improving he will let us know.  Discussed sea bands for motion sickness.

## 2019-09-26 NOTE — Progress Notes (Signed)
  Tommi Rumps, MD Phone: [phone]  Billy Acosta is a 28 y.o. male who presents today for follow-up.  ADHD: Patient has done much better on the higher dose of Adderall.  He is focusing better and it is lasting long enough.  No appetite suppression, sleep changes, or palpitations.  Allergic rhinitis: Does note occasional postnasal drip.  He has ear issues as well where he feels as though there is fluid shifting within his ears.  Occasionally he will feel little dizzy particularly when there is increased barometric pressure and does note some motion sickness at times when in the car and looking at his phone.  He has taken Zyrtec.  Social History   Tobacco Use  Smoking Status Current Some Day Smoker  . Types: E-cigarettes  Smokeless Tobacco Never Used     ROS see history of present illness  Objective  Physical Exam Vitals:   09/26/19 1615  BP: 120/70  Pulse: 81  Temp: 98.1 F (36.7 C)  SpO2: 98%    BP Readings from Last 3 Encounters:  09/26/19 120/70  12/21/18 120/68  08/18/18 102/68   Wt Readings from Last 3 Encounters:  09/26/19 203 lb 12.8 oz (92.4 kg)  08/24/19 200 lb (90.7 kg)  12/21/18 221 lb 3.2 oz (100.3 kg)    Physical Exam Constitutional:      General: He is not in acute distress.    Appearance: He is not diaphoretic.  HENT:     Right Ear: Tympanic membrane and ear canal normal.     Left Ear: Tympanic membrane and ear canal normal.  Cardiovascular:     Rate and Rhythm: Normal rate and regular rhythm.     Heart sounds: Normal heart sounds.  Pulmonary:     Effort: Pulmonary effort is normal.     Breath sounds: Normal breath sounds.  Skin:    General: Skin is warm and dry.  Neurological:     Mental Status: He is alert.      Assessment/Plan: Please see individual problem list.  Allergic rhinitis I suspect eustachian tube dysfunction related to allergies.  He will add in Flonase.  He will continue Zyrtec.  If not improving he will let us  know.  Discussed sea bands for motion sickness.  Attention deficit Doing well on current dose of Adderall.  Refills given.  Controlled substance database reviewed.  No orders of the defined types were placed in this encounter.   Meds ordered this encounter  Medications  . amphetamine-dextroamphetamine (ADDERALL) 12.5 MG tablet    Sig: Take 1 tablet by mouth 2 (two) times daily.    Dispense:  60 tablet    Refill:  0  . amphetamine-dextroamphetamine (ADDERALL) 12.5 MG tablet    Sig: Take 1 tablet by mouth 2 (two) times daily.    Dispense:  60 tablet    Refill:  0  . amphetamine-dextroamphetamine (ADDERALL) 12.5 MG tablet    Sig: Take 1 tablet by mouth 2 (two) times daily.    Dispense:  60 tablet    Refill:  0  . fluticasone (FLONASE) 50 MCG/ACT nasal spray    Sig: Place 2 sprays into both nostrils daily.    Dispense:  16 g    Refill:  [place], MD [place]

## 2019-09-26 NOTE — Patient Instructions (Signed)
Nice to see you. Please try the Flonase for your ears. Please try the sea bands for the motion sickness. I have refilled your Adderall.

## 2019-09-26 NOTE — Assessment & Plan Note (Signed)
Doing well on current dose of Adderall.  Refills given.  Controlled substance database reviewed.

## 2020-01-04 ENCOUNTER — Other Ambulatory Visit: Payer: Self-pay

## 2020-01-04 ENCOUNTER — Ambulatory Visit (INDEPENDENT_AMBULATORY_CARE_PROVIDER_SITE_OTHER): Payer: Managed Care, Other (non HMO) | Admitting: Family Medicine

## 2020-01-04 ENCOUNTER — Encounter: Payer: Self-pay | Admitting: Family Medicine

## 2020-01-04 DIAGNOSIS — R41840 Attention and concentration deficit: Secondary | ICD-10-CM | POA: Diagnosis not present

## 2020-01-04 DIAGNOSIS — F419 Anxiety disorder, unspecified: Secondary | ICD-10-CM

## 2020-01-04 DIAGNOSIS — Z87891 Personal history of nicotine dependence: Secondary | ICD-10-CM

## 2020-01-04 MED ORDER — AMPHETAMINE-DEXTROAMPHETAMINE 12.5 MG PO TABS: 12.5000 mg | ORAL_TABLET | Freq: Two times a day (BID) | ORAL | 0 refills | Status: DC

## 2020-01-04 NOTE — Progress Notes (Signed)
Virtual Visit via video Note  This visit type was conducted due to national recommendations for restrictions regarding the COVID-19 pandemic (e.g. social distancing).  This format is felt to be most appropriate for this patient at this time.  All issues noted in this document were discussed and addressed.  No physical exam was performed (except for noted visual exam findings with Video Visits).   I connected with Billy Acosta today at  4:00 PM EST by a video enabled telemedicine application and verified that I am speaking with the correct person using two identifiers. Location patient: home Location provider: work  Persons participating in the virtual visit: patient, provider  I discussed the limitations, risks, security and privacy concerns of performing an evaluation and management service by telephone and the availability of in person appointments. I also discussed with the patient that there may be a patient responsible charge related to this service. The patient expressed understanding and agreed to proceed.   Reason for visit: follow-up  HPI: ADHD: Taking Adderall.  He likes the twice daily dosing as it does not affect his lunchtime appetite.  He does occasionally have trouble falling asleep.  Does not drink alcohol or take in caffeine.  No palpitations.  History of tobacco abuse: Patient quit smoking in November 2020.  He feels quite a bit better.  He is not as winded.  He does not have any cravings currently.  Anxiety: Patient notes the anxiety is significantly better.  He feels he is much more mature and that has made a big difference.  He is ready to taper down on the Zoloft.   ROS: See pertinent positives and negatives per HPI.  Past Medical History:  Diagnosis Date  . Anxiety   . Chicken pox   . Community acquired pneumonia 08/16/2018  . GERD (gastroesophageal reflux disease)   . Hyperlipidemia     Past Surgical History:  Procedure Laterality Date  . UPJ  obstruction  2004    Family History  Problem Relation Age of Onset  . Arthritis Unknown        grandparent  . Breast cancer Unknown        grandmother  . Hyperlipidemia Unknown        parent and grandparent  . Stroke Unknown        grandparent  . Mental illness Unknown        parent, grandparent  . Prostate cancer Neg Hx   . Kidney cancer Neg Hx   . Bladder Cancer Neg Hx     SOCIAL HX: Former smoker   Current Outpatient Medications:  .  [START ON 03/03/2020] amphetamine-dextroamphetamine (ADDERALL) 12.5 MG tablet, Take 1 tablet by mouth 2 (two) times daily., Disp: 60 tablet, Rfl: 0 .  [START ON 02/01/2020] amphetamine-dextroamphetamine (ADDERALL) 12.5 MG tablet, Take 1 tablet by mouth 2 (two) times daily., Disp: 60 tablet, Rfl: 0 .  amphetamine-dextroamphetamine (ADDERALL) 12.5 MG tablet, Take 1 tablet by mouth 2 (two) times daily., Disp: 60 tablet, Rfl: 0 .  cetirizine (ZYRTEC) 10 MG tablet, Take 10 mg by mouth daily., Disp: , Rfl:  .  fluticasone (FLONASE) 50 MCG/ACT nasal spray, Place 2 sprays into both nostrils daily., Disp: 16 g, Rfl: 6 .  sertraline (ZOLOFT) 100 MG tablet, Take 0.5 tablets (50 mg total) by mouth daily., Disp: 90 tablet, Rfl: 3  EXAM:  VITALS per patient if applicable:  GENERAL: alert, oriented, appears well and in no acute distress  HEENT: atraumatic, conjunttiva clear, no obvious  abnormalities on inspection of external nose and ears  NECK: normal movements of the head and neck  LUNGS: on inspection no signs of respiratory distress, breathing rate appears normal, no obvious gross SOB, gasping or wheezing  CV: no obvious cyanosis  MS: moves all visible extremities without noticeable abnormality  PSYCH/NEURO: pleasant and cooperative, no obvious depression or anxiety, speech and thought processing grossly intact  ASSESSMENT AND PLAN:  Discussed the following assessment and plan:  Anxiety Improved.  He will go down to 50 mg on the Zoloft.  We  can revisit tapering down further at his next visit.  Attention deficit Continue Adderall.  Controlled substance database reviewed.  Refills given.  Monitor sleep.  History of tobacco abuse Former smoker.  Congratulated on quitting smoking.   No orders of the defined types were placed in this encounter.   Meds ordered this encounter  Medications  . amphetamine-dextroamphetamine (ADDERALL) 12.5 MG tablet    Sig: Take 1 tablet by mouth 2 (two) times daily.    Dispense:  60 tablet    Refill:  0  . amphetamine-dextroamphetamine (ADDERALL) 12.5 MG tablet    Sig: Take 1 tablet by mouth 2 (two) times daily.    Dispense:  60 tablet    Refill:  0  . amphetamine-dextroamphetamine (ADDERALL) 12.5 MG tablet    Sig: Take 1 tablet by mouth 2 (two) times daily.    Dispense:  60 tablet    Refill:  0  . sertraline (ZOLOFT) 100 MG tablet    Sig: Take 0.5 tablets (50 mg total) by mouth daily.    Dispense:  90 tablet    Refill:  3     I discussed the assessment and treatment plan with the patient. The patient was provided an opportunity to ask questions and all were answered. The patient agreed with the plan and demonstrated an understanding of the instructions.   The patient was advised to call back or seek an in-person evaluation if the symptoms worsen or if the condition fails to improve as anticipated.   Tommi Rumps, MD

## 2020-01-06 MED ORDER — SERTRALINE HCL 100 MG PO TABS: 50.0000 mg | ORAL_TABLET | Freq: Every day | ORAL | 3 refills | Status: DC

## 2020-01-06 NOTE — Assessment & Plan Note (Signed)
Former smoker.  Congratulated on quitting smoking.

## 2020-01-06 NOTE — Assessment & Plan Note (Signed)
Continue Adderall.  Controlled substance database reviewed.  Refills given.  Monitor sleep.

## 2020-01-06 NOTE — Assessment & Plan Note (Signed)
Improved.  He will go down to 50 mg on the Zoloft.  We can revisit tapering down further at his next visit.

## 2020-02-18 ENCOUNTER — Other Ambulatory Visit: Payer: Self-pay | Admitting: Family Medicine

## 2020-02-18 DIAGNOSIS — F419 Anxiety disorder, unspecified: Secondary | ICD-10-CM

## 2020-02-24 ENCOUNTER — Encounter: Payer: Self-pay | Admitting: Family Medicine

## 2020-03-30 ENCOUNTER — Other Ambulatory Visit: Payer: Self-pay | Admitting: Family Medicine

## 2020-04-04 ENCOUNTER — Ambulatory Visit: Payer: Managed Care, Other (non HMO) | Admitting: Family Medicine

## 2020-04-18 ENCOUNTER — Encounter: Payer: Self-pay | Admitting: Family Medicine

## 2020-04-18 ENCOUNTER — Ambulatory Visit: Payer: BLUE CROSS/BLUE SHIELD | Admitting: Family Medicine

## 2020-04-18 ENCOUNTER — Other Ambulatory Visit: Payer: Self-pay

## 2020-04-18 DIAGNOSIS — F419 Anxiety disorder, unspecified: Secondary | ICD-10-CM

## 2020-04-18 DIAGNOSIS — R41840 Attention and concentration deficit: Secondary | ICD-10-CM

## 2020-04-18 MED ORDER — AMPHETAMINE-DEXTROAMPHETAMINE 12.5 MG PO TABS: 12.5000 mg | ORAL_TABLET | Freq: Two times a day (BID) | ORAL | 0 refills | Status: DC

## 2020-04-18 MED ORDER — SERTRALINE HCL 50 MG PO TABS: 50.0000 mg | ORAL_TABLET | Freq: Every day | ORAL | 1 refills | Status: DC

## 2020-04-18 NOTE — Patient Instructions (Signed)
Nice to see you. Please continue with your Adderall.

## 2020-04-18 NOTE — Progress Notes (Signed)
  [name], MD Phone: [phone]  Billy Acosta is a 29 y.o. male who presents today for f/u.  ADHD Medication: adderall Effectiveness: yes Palpitations: no Sleep difficulty: no Appetite suppression: no  Anxiety: doing well. He notes his anxiety increased initially when we decreased his zoloft dose though has improved now. He wants to stay on his current dose of zoloft for the time being.   Social History   Tobacco Use  Smoking Status Former Smoker  . Types: E-cigarettes  Smokeless Tobacco Never Used     ROS see history of present illness  Objective  Physical Exam Vitals:   04/18/20 1337  BP: 110/70  Pulse: 89  Temp: 98.1 F (36.7 C)  SpO2: 98%    BP Readings from Last 3 Encounters:  04/18/20 110/70  09/26/19 120/70  12/21/18 120/68   Wt Readings from Last 3 Encounters:  04/18/20 199 lb 3.2 oz (90.4 kg)  01/04/20 197 lb (89.4 kg)  09/26/19 203 lb 12.8 oz (92.4 kg)    Physical Exam Constitutional:      General: He is not in acute distress.    Appearance: He is not diaphoretic.  Cardiovascular:     Rate and Rhythm: Normal rate and regular rhythm.     Heart sounds: Normal heart sounds.  Pulmonary:     Effort: Pulmonary effort is normal.     Breath sounds: Normal breath sounds.  Skin:    General: Skin is warm and dry.  Neurological:     Mental Status: He is alert.      Assessment/Plan: Please see individual problem list.  Anxiety Chronic issue.  Stable.  Continue current dose of Zoloft.  We will revisit decreasing the dose further in the future.  Attention deficit Chronic issue.  Well-controlled.  Continue Adderall.  Refill sent to pharmacy.   No orders of the defined types were placed in this encounter.   Meds ordered this encounter  Medications  . amphetamine-dextroamphetamine (ADDERALL) 12.5 MG tablet    Sig: Take 1 tablet by mouth 2 (two) times daily.    Dispense:  60 tablet    Refill:  0  . amphetamine-dextroamphetamine  (ADDERALL) 12.5 MG tablet    Sig: Take 1 tablet by mouth 2 (two) times daily.    Dispense:  60 tablet    Refill:  0  . amphetamine-dextroamphetamine (ADDERALL) 12.5 MG tablet    Sig: Take 1 tablet by mouth 2 (two) times daily.    Dispense:  60 tablet    Refill:  0    This visit occurred during the SARS-CoV-2 public health emergency.  Safety protocols were in place, including screening questions prior to the visit, additional usage of staff PPE, and extensive cleaning of exam room while observing appropriate contact time as indicated for disinfecting solutions.    [name], MD [facility]

## 2020-04-18 NOTE — Addendum Note (Signed)
Addended by: Glori Luis on: 04/18/2020 01:43 PM   Modules accepted: Orders

## 2020-04-18 NOTE — Assessment & Plan Note (Addendum)
Chronic issue.  Stable.  Continue current dose of Zoloft.  We will revisit decreasing the dose further in the future.

## 2020-04-18 NOTE — Assessment & Plan Note (Addendum)
Chronic issue.  Well-controlled.  Continue Adderall.  Refill sent to pharmacy.

## 2020-07-20 ENCOUNTER — Other Ambulatory Visit: Payer: Self-pay

## 2020-07-20 ENCOUNTER — Encounter: Payer: Self-pay | Admitting: Family Medicine

## 2020-07-20 ENCOUNTER — Ambulatory Visit: Payer: BLUE CROSS/BLUE SHIELD | Admitting: Family Medicine

## 2020-07-20 DIAGNOSIS — R41840 Attention and concentration deficit: Secondary | ICD-10-CM | POA: Diagnosis not present

## 2020-07-20 DIAGNOSIS — F419 Anxiety disorder, unspecified: Secondary | ICD-10-CM | POA: Diagnosis not present

## 2020-07-20 MED ORDER — AMPHETAMINE-DEXTROAMPHETAMINE 12.5 MG PO TABS: 12.5000 mg | ORAL_TABLET | Freq: Two times a day (BID) | ORAL | 0 refills | Status: DC

## 2020-07-20 MED ORDER — SERTRALINE HCL 100 MG PO TABS: 100.0000 mg | ORAL_TABLET | Freq: Every day | ORAL | 1 refills | Status: DC

## 2020-07-20 NOTE — Assessment & Plan Note (Signed)
Adequately controlled on Zoloft 100 mg daily.  This will be refilled.

## 2020-07-20 NOTE — Progress Notes (Signed)
  [name], MD Phone: [phone]  Billy Acosta is a 29 y.o. male who presents today for f/u.  ADHD Medication: adderall Effectiveness: yes Palpitations: no Sleep difficulty: no Appetite suppression: no  Anxiety: Patient tried to go down on his dose of Zoloft 50 mg.  He notes after a period of time anxiety return.  It manifests itself as irritability and anger.  He went back up on the Zoloft and notes no symptoms with the 100 mg dose.  No depression.   Social History   Tobacco Use  Smoking Status Former Smoker  . Types: E-cigarettes  Smokeless Tobacco Never Used     ROS see history of present illness  Objective  Physical Exam Vitals:   07/20/20 1550  BP: 105/70  Pulse: 79  Temp: 98.5 F (36.9 C)  SpO2: 97%    BP Readings from Last 3 Encounters:  07/20/20 105/70  04/18/20 110/70  09/26/19 120/70   Wt Readings from Last 3 Encounters:  07/20/20 199 lb (90.3 kg)  04/18/20 199 lb 3.2 oz (90.4 kg)  01/04/20 197 lb (89.4 kg)    Physical Exam Constitutional:      General: He is not in acute distress.    Appearance: He is not diaphoretic.  Cardiovascular:     Rate and Rhythm: Normal rate and regular rhythm.     Heart sounds: Normal heart sounds.  Pulmonary:     Effort: Pulmonary effort is normal.     Breath sounds: Normal breath sounds.  Skin:    General: Skin is warm and dry.  Neurological:     Mental Status: He is alert.      Assessment/Plan: Please see individual problem list.  Anxiety Adequately controlled on Zoloft 100 mg daily.  This will be refilled.  Attention deficit Well-controlled on current Adderall dose.  This will be refilled.  Controlled substance database reviewed.    No orders of the defined types were placed in this encounter.   Meds ordered this encounter  Medications  . amphetamine-dextroamphetamine (ADDERALL) 12.5 MG tablet    Sig: Take 1 tablet by mouth 2 (two) times daily.    Dispense:  60 tablet    Refill:   0  . amphetamine-dextroamphetamine (ADDERALL) 12.5 MG tablet    Sig: Take 1 tablet by mouth 2 (two) times daily.    Dispense:  60 tablet    Refill:  0  . amphetamine-dextroamphetamine (ADDERALL) 12.5 MG tablet    Sig: Take 1 tablet by mouth 2 (two) times daily.    Dispense:  60 tablet    Refill:  0  . sertraline (ZOLOFT) 100 MG tablet    Sig: Take 1 tablet (100 mg total) by mouth daily.    Dispense:  90 tablet    Refill:  1    This visit occurred during the SARS-CoV-2 public health emergency.  Safety protocols were in place, including screening questions prior to the visit, additional usage of staff PPE, and extensive cleaning of exam room while observing appropriate contact time as indicated for disinfecting solutions.    [name], MD [facility]

## 2020-07-20 NOTE — Assessment & Plan Note (Signed)
Well-controlled on current Adderall dose.  This will be refilled.  Controlled substance database reviewed.

## 2020-07-20 NOTE — Patient Instructions (Signed)
Nice to see you. I have refilled your medications. 

## 2020-10-24 ENCOUNTER — Ambulatory Visit: Payer: BLUE CROSS/BLUE SHIELD | Admitting: Family Medicine

## 2020-10-24 ENCOUNTER — Other Ambulatory Visit: Payer: Self-pay

## 2020-10-24 ENCOUNTER — Encounter: Payer: Self-pay | Admitting: Family Medicine

## 2020-10-24 VITALS — BP 110/70 | HR 88 | Temp 98.7°F | Ht 70.0 in | Wt 204.0 lb

## 2020-10-24 DIAGNOSIS — E663 Overweight: Secondary | ICD-10-CM

## 2020-10-24 DIAGNOSIS — R41840 Attention and concentration deficit: Secondary | ICD-10-CM

## 2020-10-24 DIAGNOSIS — K219 Gastro-esophageal reflux disease without esophagitis: Secondary | ICD-10-CM

## 2020-10-24 DIAGNOSIS — Z23 Encounter for immunization: Secondary | ICD-10-CM

## 2020-10-24 MED ORDER — AMPHETAMINE-DEXTROAMPHETAMINE 15 MG PO TABS: 15.0000 mg | ORAL_TABLET | Freq: Two times a day (BID) | ORAL | 0 refills | Status: DC

## 2020-10-24 NOTE — Patient Instructions (Signed)
Nice to see you. We will try increasing your dose of Adderall. Please let us know if you have any appetite suppression, sleep changes, or palpitations with the higher dose.

## 2020-10-24 NOTE — Assessment & Plan Note (Signed)
Ongoing issue.  He will continue over-the-counter Pepcid.  Discussed taking this 30 to 60 minutes prior to dinner.  If his symptoms worsen he will let us know.  Discussed avoiding acidic foods and drinks.

## 2020-10-24 NOTE — Assessment & Plan Note (Signed)
Less well controlled than previously.  We will increase his Adderall to 15 mg twice daily.  He will follow up in 1 month.  He will monitor for appetite suppression, sleep changes, and palpitations with this.

## 2020-10-24 NOTE — Progress Notes (Signed)
[name], MD Phone: [phone]  Billy Acosta is a 29 y.o. male who presents today for f/u.  ADHD Medication: adderall 12.5 mg BID Effectiveness: not as effective as previously, has trouble focusing Palpitations: no Sleep difficulty: no Appetite suppression: no  Overweight: Exercise is limited by the cold weather.  He does continue to do yoga and stretching.  Diet consists of lots of fruits and vegetables.  As a meat and vegetable for dinner.  GERD: This been going on for some time.  Describes as a burning sensation in the back of his throat at night when he lays down.  Has been taking Pepcid over-the-counter at dinnertime to help with this with good benefit.  No reflux on this medication.  No dysphagia, blood in stool, or abdominal pain.  Notes acidic foods and drinks make this worse.   Social History   Tobacco Use  Smoking Status Former Smoker  . Types: E-cigarettes  Smokeless Tobacco Never Used     ROS see history of present illness  Objective  Physical Exam Vitals:   10/24/20 1338  BP: 110/70  Pulse: 88  Temp: 98.7 F (37.1 C)  SpO2: 98%    BP Readings from Last 3 Encounters:  10/24/20 110/70  07/20/20 105/70  04/18/20 110/70   Wt Readings from Last 3 Encounters:  10/24/20 204 lb (92.5 kg)  07/20/20 199 lb (90.3 kg)  04/18/20 199 lb 3.2 oz (90.4 kg)    Physical Exam Constitutional:      General: He is not in acute distress.    Appearance: He is not diaphoretic.  Cardiovascular:     Rate and Rhythm: Normal rate and regular rhythm.     Heart sounds: Normal heart sounds.  Pulmonary:     Effort: Pulmonary effort is normal.     Breath sounds: Normal breath sounds.  Abdominal:     General: Bowel sounds are normal. There is no distension.     Palpations: Abdomen is soft.     Tenderness: There is no abdominal tenderness. There is no guarding or rebound.  Skin:    General: Skin is warm and dry.  Neurological:     Mental Status: He is alert.       Assessment/Plan: Please see individual problem list.  Problem List Items Addressed This Visit    Attention deficit - Primary    Less well controlled than previously.  We will increase his Adderall to 15 mg twice daily.  He will follow up in 1 month.  He will monitor for appetite suppression, sleep changes, and palpitations with this.      Relevant Medications   amphetamine-dextroamphetamine (ADDERALL) 15 MG tablet   GERD (gastroesophageal reflux disease)    Ongoing issue.  He will continue over-the-counter Pepcid.  Discussed taking this 30 to 60 minutes prior to dinner.  If his symptoms worsen he will let us know.  Discussed avoiding acidic foods and drinks.      Overweight (BMI 25.0-29.9)    Encouraged adding in some exercise.  Discussed continuing healthy diet.       Other Visit Diagnoses    Need for immunization against influenza       Relevant Orders   Flu Vaccine QUAD 36+ mos IM (Completed)      This visit occurred during the SARS-CoV-2 public health emergency.  Safety protocols were in place, including screening questions prior to the visit, additional usage of staff PPE, and extensive cleaning of exam room while observing appropriate contact time  as indicated for disinfecting solutions.    Tommi Rumps, MD [place]

## 2020-10-24 NOTE — Assessment & Plan Note (Signed)
Encouraged adding in some exercise.  Discussed continuing healthy diet.

## 2020-11-30 ENCOUNTER — Encounter: Payer: Self-pay | Admitting: Family Medicine

## 2020-11-30 ENCOUNTER — Ambulatory Visit (INDEPENDENT_AMBULATORY_CARE_PROVIDER_SITE_OTHER): Payer: 59 | Admitting: Family Medicine

## 2020-11-30 ENCOUNTER — Other Ambulatory Visit: Payer: Self-pay

## 2020-11-30 VITALS — BP 110/70 | HR 92 | Temp 98.6°F | Ht 70.0 in | Wt 200.6 lb

## 2020-11-30 DIAGNOSIS — Z833 Family history of diabetes mellitus: Secondary | ICD-10-CM

## 2020-11-30 DIAGNOSIS — E663 Overweight: Secondary | ICD-10-CM

## 2020-11-30 DIAGNOSIS — E781 Pure hyperglyceridemia: Secondary | ICD-10-CM

## 2020-11-30 DIAGNOSIS — R41840 Attention and concentration deficit: Secondary | ICD-10-CM | POA: Diagnosis not present

## 2020-11-30 LAB — LIPID PANEL
Cholesterol: 145 mg/dL (ref 0–200)
HDL: 29.1 mg/dL — ABNORMAL LOW (ref >39.00)
LDL Cholesterol: 80 mg/dL (ref 0–99)
NonHDL: 116.25
Total CHOL/HDL Ratio: 5
Triglycerides: 183 mg/dL — ABNORMAL HIGH (ref 0.0–149.0)
VLDL: 36.6 mg/dL (ref 0.0–40.0)

## 2020-11-30 LAB — COMPREHENSIVE METABOLIC PANEL
ALT: 10 U/L (ref 0–53)
AST: 14 U/L (ref 0–37)
Albumin: 4.8 g/dL (ref 3.5–5.2)
Alkaline Phosphatase: 71 U/L (ref 39–117)
BUN: 13 mg/dL (ref 6–23)
CO2: 27 mEq/L (ref 19–32)
Calcium: 10 mg/dL (ref 8.4–10.5)
Chloride: 101 mEq/L (ref 96–112)
Creatinine, Ser: 0.89 mg/dL (ref 0.40–1.50)
GFR: 114.53 mL/min (ref >60.00)
Glucose, Bld: 98 mg/dL (ref 70–99)
Potassium: 5 mEq/L (ref 3.5–5.1)
Sodium: 137 mEq/L (ref 135–145)
Total Bilirubin: 0.4 mg/dL (ref 0.2–1.2)
Total Protein: 7.1 g/dL (ref 6.0–8.3)

## 2020-11-30 LAB — HEMOGLOBIN A1C: Hgb A1c MFr Bld: 5.9 % (ref 4.6–6.5)

## 2020-11-30 MED ORDER — AMPHETAMINE-DEXTROAMPHETAMINE 15 MG PO TABS: 15.0000 mg | ORAL_TABLET | Freq: Two times a day (BID) | ORAL | 0 refills | Status: DC

## 2020-11-30 NOTE — Assessment & Plan Note (Signed)
Check A1c. 

## 2020-11-30 NOTE — Progress Notes (Signed)
Billy Rumps, MD Phone: [phone]  LONG BRIMAGE is a 30 y.o. male who presents today for f/u.  ADHD Medication: adderall 15 mg BID Effectiveness: yes Palpitations: no Sleep difficulty: no Appetite suppression: no Does note slight dry mouth since increasing the Adderall dose.  Drinking water resolves this.  Family history diabetes: Patient's brother was just diagnosed with diabetes.  He notes they diagnosed it as type I despite it being later in life.  His brother is 34.  The patient has no polyuria or polydipsia.  Social History   Tobacco Use  Smoking Status Former Smoker  . Types: E-cigarettes  Smokeless Tobacco Never Used    Current Outpatient Medications on File Prior to Visit  Medication Sig Dispense Refill  . cetirizine (ZYRTEC) 10 MG tablet Take 10 mg by mouth daily.    . fluticasone (FLONASE) 50 MCG/ACT nasal spray SPRAY 2 SPRAYS INTO EACH NOSTRIL EVERY DAY 48 mL 2  . sertraline (ZOLOFT) 100 MG tablet Take 1 tablet (100 mg total) by mouth daily. 90 tablet 1   No current facility-administered medications on file prior to visit.     ROS see history of present illness  Objective  Physical Exam Vitals:   11/30/20 1119  BP: 110/70  Pulse: 92  Temp: 98.6 F (37 C)  SpO2: 98%    BP Readings from Last 3 Encounters:  11/30/20 110/70  10/24/20 110/70  07/20/20 105/70   Wt Readings from Last 3 Encounters:  11/30/20 200 lb 9.6 oz (91 kg)  10/24/20 204 lb (92.5 kg)  07/20/20 199 lb (90.3 kg)    Physical Exam Constitutional:      General: He is not in acute distress.    Appearance: He is not diaphoretic.  Cardiovascular:     Rate and Rhythm: Normal rate and regular rhythm.     Heart sounds: Normal heart sounds.  Pulmonary:     Effort: Pulmonary effort is normal.     Breath sounds: Normal breath sounds.  Musculoskeletal:        General: No edema.  Skin:    General: Skin is warm and dry.  Neurological:     Mental Status: He is alert.       Assessment/Plan: Please see individual problem list.  Problem List Items Addressed This Visit    Attention deficit - Primary    Well-controlled on Adderall 15 mg twice daily.  He will continue this medication.  Refills given.  Controlled substance database reviewed.  He will monitor the dry mouth and if it worsens he will let us know.      Relevant Medications   amphetamine-dextroamphetamine (ADDERALL) 15 MG tablet (Start on 01/28/2021)   amphetamine-dextroamphetamine (ADDERALL) 15 MG tablet (Start on 12/31/2020)   amphetamine-dextroamphetamine (ADDERALL) 15 MG tablet   Family history of diabetes mellitus    Labs as outlined.      Relevant Orders   HgB A1c   Hypertriglyceridemia    Check lipid panel.      Relevant Orders   Comp Met (CMET)   Lipid panel   Overweight (BMI 25.0-29.9)    Check A1c.      Relevant Orders   HgB A1c     Health maintenance: Encouraged COVID-19 booster.  This visit occurred during the SARS-CoV-2 public health emergency.  Safety protocols were in place, including screening questions prior to the visit, additional usage of staff PPE, and extensive cleaning of exam room while observing appropriate contact time as indicated for disinfecting solutions.  Billy Rumps, MD [place]

## 2020-11-30 NOTE — Patient Instructions (Signed)
Nice to see. Will contact you with your lab results. I have refilled your Adderall.

## 2020-11-30 NOTE — Assessment & Plan Note (Signed)
Labs as outlined.

## 2020-11-30 NOTE — Assessment & Plan Note (Signed)
Check lipid panel  

## 2020-11-30 NOTE — Assessment & Plan Note (Signed)
Well-controlled on Adderall 15 mg twice daily.  He will continue this medication.  Refills given.  Controlled substance database reviewed.  He will monitor the dry mouth and if it worsens he will let us know.

## 2021-03-05 ENCOUNTER — Other Ambulatory Visit: Payer: Self-pay

## 2021-03-05 ENCOUNTER — Encounter: Payer: Self-pay | Admitting: Family Medicine

## 2021-03-05 ENCOUNTER — Ambulatory Visit (INDEPENDENT_AMBULATORY_CARE_PROVIDER_SITE_OTHER): Payer: 59 | Admitting: Family Medicine

## 2021-03-05 DIAGNOSIS — F419 Anxiety disorder, unspecified: Secondary | ICD-10-CM | POA: Diagnosis not present

## 2021-03-05 DIAGNOSIS — R41840 Attention and concentration deficit: Secondary | ICD-10-CM

## 2021-03-05 DIAGNOSIS — Z91010 Allergy to peanuts: Secondary | ICD-10-CM | POA: Insufficient documentation

## 2021-03-05 MED ORDER — AMPHETAMINE-DEXTROAMPHETAMINE 15 MG PO TABS: 15.0000 mg | ORAL_TABLET | Freq: Two times a day (BID) | ORAL | 0 refills | Status: DC

## 2021-03-05 MED ORDER — FLUTICASONE PROPIONATE 50 MCG/ACT NA SUSP: NASAL | 2 refills | Status: AC

## 2021-03-05 MED ORDER — SERTRALINE HCL 100 MG PO TABS: 100.0000 mg | ORAL_TABLET | Freq: Every day | ORAL | 1 refills | Status: DC

## 2021-03-05 NOTE — Progress Notes (Signed)
  [name], MD Phone: [phone]  Billy Acosta is a 30 y.o. male who presents today for f/u.  ADHD Medication: adderall 15 mg BID Effectiveness: yes Palpitations: no Sleep difficulty: no Appetite suppression: no  Anxiety: Patient denies anxiety.  No depression.  No SI.  Notes that Zoloft is beneficial.  He would like to stay on this.  Peanut allergy: Patient notes he had allergy testing several years ago he went back and looked at it recently as he was having issues with stomach cramping and loose stools.  He had tried cutting out lactose though that did not make enough of a difference.  He ended up realizing he was allergic to peanuts and he cut out peanuts and his symptoms have resolved.   Social History   Tobacco Use  Smoking Status Former Smoker  . Types: E-cigarettes  Smokeless Tobacco Never Used    Current Outpatient Medications on File Prior to Visit  Medication Sig Dispense Refill  . cetirizine (ZYRTEC) 10 MG tablet Take 10 mg by mouth daily.     No current facility-administered medications on file prior to visit.     ROS see history of present illness  Objective  Physical Exam Vitals:   03/05/21 1105  BP: 118/70  Pulse: 76  Temp: (!) 97 F (36.1 C)  SpO2: 99%    BP Readings from Last 3 Encounters:  03/05/21 118/70  11/30/20 110/70  10/24/20 110/70   Wt Readings from Last 3 Encounters:  03/05/21 207 lb 6.4 oz (94.1 kg)  11/30/20 200 lb 9.6 oz (91 kg)  10/24/20 204 lb (92.5 kg)    Physical Exam Constitutional:      General: He is not in acute distress.    Appearance: He is not diaphoretic.  Cardiovascular:     Rate and Rhythm: Normal rate and regular rhythm.     Heart sounds: Normal heart sounds.  Pulmonary:     Effort: Pulmonary effort is normal.     Breath sounds: Normal breath sounds.  Skin:    General: Skin is warm and dry.  Neurological:     Mental Status: He is alert.      Assessment/Plan: Please see individual  problem list.  Problem List Items Addressed This Visit    Anxiety    Well-controlled.  Continue Zoloft 100 mg once daily.      Relevant Medications   sertraline (ZOLOFT) 100 MG tablet   Attention deficit    Stable.  Continue Adderall 15 mg twice daily.  Controlled substance database reviewed.  Refills provided.      Relevant Medications   amphetamine-dextroamphetamine (ADDERALL) 15 MG tablet (Start on 05/15/2021)   amphetamine-dextroamphetamine (ADDERALL) 15 MG tablet (Start on 04/15/2021)   amphetamine-dextroamphetamine (ADDERALL) 15 MG tablet (Start on 03/16/2021)   Peanut allergy    Patient with prior GI symptoms related to eating peanuts.  These have resolved since he cut out peanuts.         This visit occurred during the SARS-CoV-2 public health emergency.  Safety protocols were in place, including screening questions prior to the visit, additional usage of staff PPE, and extensive cleaning of exam room while observing appropriate contact time as indicated for disinfecting solutions.    [name], MD [facility]

## 2021-03-05 NOTE — Assessment & Plan Note (Signed)
Well-controlled.  Continue Zoloft 100 mg once daily.

## 2021-03-05 NOTE — Assessment & Plan Note (Addendum)
Patient with prior GI symptoms related to eating peanuts.  These have resolved since he cut out peanuts.

## 2021-03-05 NOTE — Patient Instructions (Addendum)
Nice to see you. I have refilled your Adderall.

## 2021-03-05 NOTE — Assessment & Plan Note (Signed)
Stable.  Continue Adderall 15 mg twice daily.  Controlled substance database reviewed.  Refills provided.

## 2021-03-20 ENCOUNTER — Other Ambulatory Visit: Payer: Self-pay | Admitting: Family Medicine

## 2021-03-20 DIAGNOSIS — F419 Anxiety disorder, unspecified: Secondary | ICD-10-CM

## 2021-04-24 ENCOUNTER — Encounter: Payer: Self-pay | Admitting: Family Medicine

## 2021-06-04 ENCOUNTER — Ambulatory Visit: Payer: 59 | Admitting: Family Medicine

## 2021-06-19 ENCOUNTER — Encounter: Payer: Self-pay | Admitting: Family Medicine

## 2021-06-19 ENCOUNTER — Other Ambulatory Visit: Payer: Self-pay

## 2021-06-19 ENCOUNTER — Ambulatory Visit (INDEPENDENT_AMBULATORY_CARE_PROVIDER_SITE_OTHER): Payer: 59 | Admitting: Family Medicine

## 2021-06-19 DIAGNOSIS — E663 Overweight: Secondary | ICD-10-CM | POA: Diagnosis not present

## 2021-06-19 DIAGNOSIS — F419 Anxiety disorder, unspecified: Secondary | ICD-10-CM

## 2021-06-19 DIAGNOSIS — R41840 Attention and concentration deficit: Secondary | ICD-10-CM | POA: Diagnosis not present

## 2021-06-19 MED ORDER — SERTRALINE HCL 100 MG PO TABS: 100.0000 mg | ORAL_TABLET | Freq: Every day | ORAL | 1 refills | Status: AC

## 2021-06-19 MED ORDER — AMPHETAMINE-DEXTROAMPHETAMINE 15 MG PO TABS: 15.0000 mg | ORAL_TABLET | Freq: Two times a day (BID) | ORAL | 0 refills | Status: AC

## 2021-06-19 NOTE — Assessment & Plan Note (Signed)
Well-controlled.  He will continue Zoloft 100 mg daily. °

## 2021-06-19 NOTE — Assessment & Plan Note (Signed)
Encouraged increasing exercise.  Discussed healthier meal options.

## 2021-06-19 NOTE — Progress Notes (Signed)
  [name], MD Phone: [phone]  JAWAAN ADACHI is a 30 y.o. male who presents today for f/u.  ADHD Medication: adderall Effectiveness: yes Palpitations: no Sleep difficulty: no Appetite suppression: no  Anxiety: stable. No depression. Zoloft has been helpful.  Overweight: Patient has been walking recently and is try to get 10,000 steps a day.  He has been eating more frozen easy to eat meals.  They have been busy with her children.  Not much alcohol intake.   Social History   Tobacco Use  Smoking Status Former   Types: E-cigarettes  Smokeless Tobacco Never    Current Outpatient Medications on File Prior to Visit  Medication Sig Dispense Refill   cetirizine (ZYRTEC) 10 MG tablet Take 10 mg by mouth daily.     fluticasone (FLONASE) 50 MCG/ACT nasal spray SPRAY 2 SPRAYS INTO EACH NOSTRIL EVERY DAY 48 mL 2   No current facility-administered medications on file prior to visit.     ROS see history of present illness  Objective  Physical Exam Vitals:   06/19/21 1451  BP: 118/80  Pulse: 80  Temp: 98.2 F (36.8 C)  SpO2: 99%    BP Readings from Last 3 Encounters:  06/19/21 118/80  03/05/21 118/70  11/30/20 110/70   Wt Readings from Last 3 Encounters:  06/19/21 207 lb (93.9 kg)  03/05/21 207 lb 6.4 oz (94.1 kg)  11/30/20 200 lb 9.6 oz (91 kg)    Physical Exam Constitutional:      General: He is not in acute distress.    Appearance: He is not diaphoretic.  Cardiovascular:     Rate and Rhythm: Normal rate and regular rhythm.     Heart sounds: Normal heart sounds.  Pulmonary:     Effort: Pulmonary effort is normal.     Breath sounds: Normal breath sounds.  Skin:    General: Skin is warm and dry.  Neurological:     Mental Status: He is alert.     Assessment/Plan: Please see individual problem list.  Problem List Items Addressed This Visit     Anxiety    Well-controlled.  He will continue Zoloft 100 mg daily.       Relevant  Medications   sertraline (ZOLOFT) 100 MG tablet   Attention deficit    Well-controlled.  He can continue Adderall 15 mg twice daily.  Controlled substance database reviewed.  Refills given.       Relevant Medications   amphetamine-dextroamphetamine (ADDERALL) 15 MG tablet (Start on 08/27/2021)   amphetamine-dextroamphetamine (ADDERALL) 15 MG tablet (Start on 07/28/2021)   amphetamine-dextroamphetamine (ADDERALL) 15 MG tablet (Start on 06/27/2021)   Overweight (BMI 25.0-29.9)    Encouraged increasing exercise.  Discussed healthier meal options.        Return in about 3 months (around 09/19/2021) for ADHD.  This visit occurred during the SARS-CoV-2 public health emergency.  Safety protocols were in place, including screening questions prior to the visit, additional usage of staff PPE, and extensive cleaning of exam room while observing appropriate contact time as indicated for disinfecting solutions.    [name], MD [facility]

## 2021-06-19 NOTE — Assessment & Plan Note (Signed)
Well-controlled.  He can continue Adderall 15 mg twice daily.  Controlled substance database reviewed.  Refills given.

## 2021-06-19 NOTE — Patient Instructions (Signed)
Nice to see you. Please try to increase your exercise and eat a healthier diet.   I refilled your medicines as well.

## 2021-07-22 ENCOUNTER — Emergency Department
Admission: EM | Admit: 2021-07-22 | Discharge: 2021-07-22 | Disposition: A | Discharge status: Home / Self Care | Payer: 59 | Attending: Emergency Medicine | Admitting: Emergency Medicine

## 2021-07-22 ENCOUNTER — Other Ambulatory Visit: Payer: Self-pay

## 2021-07-22 DIAGNOSIS — R41840 Attention and concentration deficit: Secondary | ICD-10-CM | POA: Diagnosis present

## 2021-07-22 DIAGNOSIS — F39 Unspecified mood [affective] disorder: Secondary | ICD-10-CM | POA: Insufficient documentation

## 2021-07-22 DIAGNOSIS — R4586 Emotional lability: Secondary | ICD-10-CM

## 2021-07-22 DIAGNOSIS — Z79899 Other long term (current) drug therapy: Secondary | ICD-10-CM | POA: Diagnosis not present

## 2021-07-22 DIAGNOSIS — Z91010 Allergy to peanuts: Secondary | ICD-10-CM | POA: Insufficient documentation

## 2021-07-22 DIAGNOSIS — Z87891 Personal history of nicotine dependence: Secondary | ICD-10-CM | POA: Insufficient documentation

## 2021-07-22 DIAGNOSIS — Z046 Encounter for general psychiatric examination, requested by authority: Secondary | ICD-10-CM | POA: Diagnosis present

## 2021-07-22 DIAGNOSIS — Y900 Blood alcohol level of less than 20 mg/100 ml: Secondary | ICD-10-CM | POA: Diagnosis not present

## 2021-07-22 LAB — URINE DRUG SCREEN, QUALITATIVE (ARMC ONLY)
Amphetamines, Ur Screen: NOT DETECTED
Barbiturates, Ur Screen: NOT DETECTED
Benzodiazepine, Ur Scrn: NOT DETECTED
Cannabinoid 50 Ng, Ur ~~LOC~~: POSITIVE — AB
Cocaine Metabolite,Ur ~~LOC~~: NOT DETECTED
MDMA (Ecstasy)Ur Screen: NOT DETECTED
Methadone Scn, Ur: NOT DETECTED
Opiate, Ur Screen: NOT DETECTED
Phencyclidine (PCP) Ur S: NOT DETECTED
Tricyclic, Ur Screen: NOT DETECTED

## 2021-07-22 LAB — CBC
HCT: 43.7 % (ref 39.0–52.0)
Hemoglobin: 15.1 g/dL (ref 13.0–17.0)
MCH: 31.5 pg (ref 26.0–34.0)
MCHC: 34.6 g/dL (ref 30.0–36.0)
MCV: 91 fL (ref 80.0–100.0)
Platelets: 256 10*3/uL (ref 150–400)
RBC: 4.8 MIL/uL (ref 4.22–5.81)
RDW: 12.5 % (ref 11.5–15.5)
WBC: 9 10*3/uL (ref 4.0–10.5)
nRBC: 0 % (ref 0.0–0.2)

## 2021-07-22 LAB — COMPREHENSIVE METABOLIC PANEL
ALT: 14 U/L (ref 0–44)
AST: 20 U/L (ref 15–41)
Albumin: 4.4 g/dL (ref 3.5–5.0)
Alkaline Phosphatase: 59 U/L (ref 38–126)
Anion gap: 7 (ref 5–15)
BUN: 11 mg/dL (ref 6–20)
CO2: 28 mmol/L (ref 22–32)
Calcium: 9.3 mg/dL (ref 8.9–10.3)
Chloride: 104 mmol/L (ref 98–111)
Creatinine, Ser: 0.91 mg/dL (ref 0.61–1.24)
GFR, Estimated: >60 mL/min (ref >60)
Glucose, Bld: 161 mg/dL — ABNORMAL HIGH (ref 70–99)
Potassium: 3.9 mmol/L (ref 3.5–5.1)
Sodium: 139 mmol/L (ref 135–145)
Total Bilirubin: 0.8 mg/dL (ref 0.3–1.2)
Total Protein: 7.2 g/dL (ref 6.5–8.1)

## 2021-07-22 LAB — ACETAMINOPHEN LEVEL: Acetaminophen (Tylenol), Serum: <10 ug/mL — ABNORMAL LOW (ref 10–30)

## 2021-07-22 LAB — ETHANOL: Alcohol, Ethyl (B): <10 mg/dL (ref <10)

## 2021-07-22 LAB — SALICYLATE LEVEL: Salicylate Lvl: <7 mg/dL — ABNORMAL LOW (ref 7.0–30.0)

## 2021-07-22 NOTE — BH Assessment (Signed)
Comprehensive Clinical Assessment (CCA) Note  07/22/2021 Billy Acosta [id number]  Billy Acosta, 30 year old male who presents to [facility] ED voluntarily for treatment. Per triage note, Pt to ED for psych eval, pt tearful in triage. Reports depression, anxiety, wanting "everything to end". Pt reports SI, denies plan. Very emotional in triage, repeatedly asking for help.   During TTS assessment pt presents alert and oriented x 4, restless but cooperative, and mood-congruent with affect. The pt does not appear to be responding to internal or external stimuli. Neither is the pt presenting with any delusional thinking. Pt verified the information provided to triage RN.   Pt identifies his main complaint to be that he wants help with managing his emotions. Patient reports he has a "flood of emotions and he experiences them differently than others." Patient reports he is not depressed or sad yet he has mood swings when he is around friends or family. Patient states he becomes "easily triggered" when trying to get his point across or express how he is feeling. Patient reports out of frustration he has grabbed a chair and slammed it against a door just to release his emotions. Patient reports he has does not want to hurt himself or anyone else. Patient reports having a hard time falling to sleep because of racing thoughts but generally sleeps 6 hrs a night. Patient reports he does not have much of an appetite and hasn't been eating regularly. Patient states he recently stopped smoking marijuana last week after smoking daily for over 15years. Pt reports having no INPT or OPT hx but is prescribed psych meds by his primary physician. Patient states he is compliant with his medications. Pt reports family hx of MH with his mom, sister and grandmother. Pt denies SI/HI/AH/VH. Pt is able to contract for safety. Patient reports he wants to get started seeing a therapist on a regular basis along with taking his medication.     Per Dr. Toni Amend pt does not meet criteria for inpatient psychiatric admission.    Chief Complaint:  Chief Complaint  Patient presents with   Psychiatric Evaluation   Visit Diagnosis: Mood disorder    CCA Screening, Triage and Referral (STR)  Patient Reported Information How did you hear about [place]? Self  Referral name: No data recorded Referral phone number: No data recorded  Whom do you see for routine medical problems? No data recorded Practice/Facility Name: No data recorded Practice/Facility Phone Number: No data recorded Name of Contact: No data recorded Contact Number: No data recorded Contact Fax Number: No data recorded Prescriber Name: No data recorded Prescriber Address (if known): No data recorded  What Is the Reason for Your Visit/Call Today? Patient reports he came to the ED to get help with managing his emotions and get connected with a psychiatrist.  How Long Has This Been Causing You Problems? 1-6 months  What Do You Feel Would Help You the Most Today? Medication(s); Social Support (Assessment)   Have You Recently Been in Any Inpatient Treatment (Hospital/Detox/Crisis Center/28-Day Program)? No data recorded Name/Location of Program/Hospital:No data recorded How Long Were You There? No data recorded When Were You Discharged? No data recorded  Have You Ever Received Services From [facility] Before? No data recorded Who Do You See at [facility]? No data recorded  Have You Recently Had Any Thoughts About Hurting Yourself? No  Are You Planning to Commit Suicide/Harm Yourself At This time? No   Have you Recently Had Thoughts About Hurting Someone [name]? No  Explanation: No data recorded  Have You Used Any Alcohol or Drugs in the Past 24 Hours? No  How Long Ago Did You Use Drugs or Alcohol? No data recorded What Did You Use and How Much? No data recorded  Do You Currently Have a Therapist/Psychiatrist? No  Name of Therapist/Psychiatrist: No data  recorded  Have You Been Recently Discharged From Any Office Practice or Programs? No  Explanation of Discharge From Practice/Program: No data recorded    CCA Screening Triage Referral Assessment Type of Contact: Face-to-Face  Is this Initial or Reassessment? No data recorded Date Telepsych consult ordered in [facility]:  No data recorded Time Telepsych consult ordered in [facility]:  No data recorded  Patient Reported Information Reviewed? No data recorded Patient Left Without Being Seen? No data recorded Reason for Not Completing Assessment: No data recorded  Collateral Involvement: None provided   Does Patient Have a Court Appointed Legal Guardian? No data recorded Name and Contact of Legal Guardian: No data recorded If Minor and Not Living with Parent(s), Who has Custody? n/a  Is CPS involved or ever been involved? Never  Is APS involved or ever been involved? Never   Patient Determined To Be At Risk for Harm To Self or Others Based on Review of Patient Reported Information or Presenting Complaint? No  Method: No data recorded Availability of Means: No data recorded Intent: No data recorded Notification Required: No data recorded Additional Information for Danger to Others Potential: No data recorded Additional Comments for Danger to Others Potential: No data recorded Are There Guns or Other Weapons in Your Home? No data recorded Types of Guns/Weapons: No data recorded Are These Weapons Safely Secured?                            No data recorded Who Could Verify You Are Able To Have These Secured: No data recorded Do You Have any Outstanding Charges, Pending Court Dates, Parole/Probation? No data recorded Contacted To Inform of Risk of Harm To Self or Others: No data recorded  Location of Assessment: [facility]   Does Patient Present under Involuntary Commitment? 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   Allergic rhinitis 01/28/2017   Chronic pain of both shoulders 09/24/2016   Attention deficit 06/26/2016   Hypertriglyceridemia 10/05/2015   Overweight (BMI 25.0-29.9) 09/04/2015   History of tobacco abuse 09/04/2015   Anxiety 09/04/2015    Patient Centered Plan: Patient is on the following Treatment Plan(s):     Referrals to Alternative Service(s): Referred to Alternative Service(s):   Place:   Date:   Time:    Referred to Alternative Service(s):   Place:   Date:   Time:    Referred to Alternative Service(s):   Place:   Date:   Time:    Referred to Alternative Service(s):   Place:   Date:   Time:     Ratasha Fabre  Dierdre Searles, Counselor, LCAS-A

## 2021-07-22 NOTE — ED Triage Notes (Signed)
Pt to ED for psych eval, pt tearful in triage. Reports depression, anxiety, wanting "everything to end". Pt reports SI, denies plan Very emotional in triage, repeatedly asking for help

## 2021-07-22 NOTE — ED Notes (Signed)
Pt dressed out with this RN and Mel NT. Belongings include: Flip flops Shorts Rainbow colored boxers [employer] Watch cellphone Wallet vape Black and red ring

## 2021-07-22 NOTE — Consult Note (Signed)
[facility] Face-to-Face Psychiatry Consult   Reason for Consult: Consult for 30 year old man came voluntarily to the emergency room seeking help for emotional problems Referring Physician: Sidney Ace Patient Identification: Billy Acosta MRN:  [id number] Principal Diagnosis: Mood disorder [facility]) Diagnosis:  Principal Problem:   Mood disorder (HCC) Active Problems:   Attention deficit   Total Time spent with patient: 1 hour  Subjective:   Billy Acosta is a 30 y.o. male patient admitted with "my emotions are too strong".  HPI: Patient seen chart reviewed.  30 year old man presented voluntarily.  His chief complaint is that him his emotions are too strong and work too quickly compared to other people.  It is somewhat hard at times to follow his description of this not because I think he is delusional that because he has something of a hard time putting it into words.  Patient is in some emotional distressed but does not feel that he is "depressed".  He denies having any suicidal thought or intent or any thought of hurting on anyone else.  He says that he will get into conversations with his wife and other people who are close with him and get very frustrated because he cannot express his emotions.  He had an incident sometime in the last day or 2 of pounding a chair against a wall in frustration but insists that there was no thought of hurting anyone else.  He denies hallucinations or psychotic symptoms.  Patient says that his sleep is not particularly different from usual.  Occasional problems falling asleep but nothing that he feels is a longstanding problem.  He does mention that he had been using marijuana on a daily basis for years and only in the last week decided to discontinue it.  I tried to clarify whether his current symptoms only started after he had stopped using marijuana but he insists that he has been having these symptoms for years.  He is currently on Zoloft and Adderall prescribed by his  primary care doctor and says he is compliant with them.  No recent changes to medicine.  Denies other drug abuse.  Patient is employed in IT.  Married 3 young children at home.  Past Psychiatric History: Patient has seen his primary care doctor for medication management for years.  Currently on Zoloft 100 mg a day and Adderall a total of 30 mg a day.  Cannot remember any other medicines other than Vyvanse that he had taken in the past.  Denies any history of suicide attempts or violence.  No previous hospitalization.  Risk to Self:   Risk to Others:   Prior Inpatient Therapy:   Prior Outpatient Therapy:    Past Medical History:  Past Medical History:  Diagnosis Date   Anxiety    Chicken pox    Community acquired pneumonia 08/16/2018   GERD (gastroesophageal reflux disease)    Hyperlipidemia     Past Surgical History:  Procedure Laterality Date   UPJ obstruction  2004   Family History:  Family History  Problem Relation Age of Onset   Arthritis Unknown        grandparent   Breast cancer Unknown        grandmother   Hyperlipidemia Unknown        parent and grandparent   Stroke Unknown        grandparent   Mental illness Unknown        parent, grandparent   Prostate cancer Neg Hx    Kidney  cancer Neg Hx    Bladder Cancer Neg Hx    Family Psychiatric  History: Family history sounds like extensive mental health problems on both mother and father's side although he is at something of a loss to describe them.  Sounds like his mother and sister have mood problems.  He believes that there are people on his father's side who may have committed suicide but seems not to know much detail. Social History:  Social History   Substance and Sexual Activity  Alcohol Use Yes   Alcohol/week: 0.0 standard drinks     Social History   Substance and Sexual Activity  Drug Use Yes   Types: Marijuana    Social History   Socioeconomic History   Marital status: Unknown    Spouse name: Not on  file   Number of children: Not on file   Years of education: Not on file   Highest education level: Not on file  Occupational History   Not on file  Tobacco Use   Smoking status: Former    Types: E-cigarettes   Smokeless tobacco: Never  Substance and Sexual Activity   Alcohol use: Yes    Alcohol/week: 0.0 standard drinks   Drug use: Yes    Types: Marijuana   Sexual activity: Not on file  Other Topics Concern   Not on file  Social History Narrative   Not on file   Social Determinants of Health   Financial Resource Strain: Not on file  Food Insecurity: Not on file  Transportation Needs: Not on file  Physical Activity: Not on file  Stress: Not on file  Social Connections: Not on file   Additional Social History:    Allergies:   Allergies  Allergen Reactions   Iodine Other (See Comments)   Peanut-Containing Drug Products Diarrhea   Amoxicillin Rash    Labs:  Results for orders placed or performed during the hospital encounter of 07/22/21 (from the past 48 hour(s))  Comprehensive metabolic panel     Status: Abnormal   Collection Time: 07/22/21 12:02 PM  Result Value Ref Range   Sodium 139 135 - 145 mmol/L   Potassium 3.9 3.5 - 5.1 mmol/L   Chloride 104 98 - 111 mmol/L   CO2 28 22 - 32 mmol/L   Glucose, Bld 161 (H) 70 - 99 mg/dL    Comment: Glucose reference range applies only to samples taken after fasting for at least 8 hours.   BUN 11 6 - 20 mg/dL   Creatinine, Ser 6.14 0.61 - 1.24 mg/dL   Calcium 9.3 8.9 - 43.1 mg/dL   Total Protein 7.2 6.5 - 8.1 g/dL   Albumin 4.4 3.5 - 5.0 g/dL   AST 20 15 - 41 U/L   ALT 14 0 - 44 U/L   Alkaline Phosphatase 59 38 - 126 U/L   Total Bilirubin 0.8 0.3 - 1.2 mg/dL   GFR, Estimated >54 >00 mL/min    Comment: (NOTE) Calculated using the CKD-EPI Creatinine Equation (2021)    Anion gap 7 5 - 15    Comment: Performed at [facility], [address] [place], [place] [address]  Ethanol     Status: None   Collection  Time: 07/22/21 12:02 PM  Result Value Ref Range   Alcohol, Ethyl (B) <10 <10 mg/dL    Comment: (NOTE) Lowest detectable limit for serum alcohol is 10 mg/dL.  For medical purposes only. Performed at [facility], [address] [place], [place] [address]  Salicylate level     Status: Abnormal   Collection Time: 07/22/21 12:02 PM  Result Value Ref Range   Salicylate Lvl <7.0 (L) 7.0 - 30.0 mg/dL    Comment: Performed at [facility], [address] [place], [place] [address]  Acetaminophen level     Status: Abnormal   Collection Time: 07/22/21 12:02 PM  Result Value Ref Range   Acetaminophen (Tylenol), Serum <10 (L) 10 - 30 ug/mL    Comment: (NOTE) Therapeutic concentrations vary significantly. A range of 10-30 ug/mL  may be an effective concentration for many patients. However, some  are best treated at concentrations outside of this range. Acetaminophen concentrations >150 ug/mL at 4 hours after ingestion  and >50 ug/mL at 12 hours after ingestion are often associated with  toxic reactions.  Performed at [facility], [address] [place], [place] [address]   cbc     Status: None   Collection Time: 07/22/21 12:02 PM  Result Value Ref Range   WBC 9.0 4.0 - 10.5 K/uL   RBC 4.80 4.22 - 5.81 MIL/uL   Hemoglobin 15.1 13.0 - 17.0 g/dL   HCT 01.6 01.0 - 93.2 %   MCV 91.0 80.0 - 100.0 fL   MCH 31.5 26.0 - 34.0 pg   MCHC 34.6 30.0 - 36.0 g/dL   RDW 35.5 73.2 - 20.2 %   Platelets 256 150 - 400 K/uL   nRBC 0.0 0.0 - 0.2 %    Comment: Performed at [facility], [address] [place], [place] [address]  Urine Drug Screen, Qualitative     Status: Abnormal   Collection Time: 07/22/21 12:02 PM  Result Value Ref Range   Tricyclic, Ur Screen NONE DETECTED NONE DETECTED   Amphetamines, Ur Screen NONE DETECTED NONE DETECTED   MDMA (Ecstasy)Ur Screen NONE DETECTED NONE DETECTED   Cocaine Metabolite,Ur [place] NONE DETECTED NONE DETECTED    Opiate, Ur Screen NONE DETECTED NONE DETECTED   Phencyclidine (PCP) Ur S NONE DETECTED NONE DETECTED   Cannabinoid 50 Ng, Ur [place] POSITIVE (A) NONE DETECTED   Barbiturates, Ur Screen NONE DETECTED NONE DETECTED   Benzodiazepine, Ur Scrn NONE DETECTED NONE DETECTED   Methadone Scn, Ur NONE DETECTED NONE DETECTED    Comment: (NOTE) Tricyclics + metabolites, urine    Cutoff 1000 ng/mL Amphetamines + metabolites, urine  Cutoff 1000 ng/mL MDMA (Ecstasy), urine              Cutoff 500 ng/mL Cocaine Metabolite, urine          Cutoff 300 ng/mL Opiate + metabolites, urine        Cutoff 300 ng/mL Phencyclidine (PCP), urine         Cutoff 25 ng/mL Cannabinoid, urine                 Cutoff 50 ng/mL Barbiturates + metabolites, urine  Cutoff 200 ng/mL Benzodiazepine, urine              Cutoff 200 ng/mL Methadone, urine                   Cutoff 300 ng/mL  The urine drug screen provides only a preliminary, unconfirmed analytical test result and should not be used for non-medical purposes. Clinical consideration and professional judgment should be applied to any positive drug screen result due to possible interfering substances. A more specific alternate chemical method must be used in order to obtain a confirmed analytical result. Gas chromatography /  mass spectrometry (GC/MS) is the preferred confirm atory method. Performed at [facility], [address] [place], [place] [address]     No current facility-administered medications for this encounter.   Current Outpatient Medications  Medication Sig Dispense Refill   [START ON 08/27/2021] amphetamine-dextroamphetamine (ADDERALL) 15 MG tablet Take 1 tablet by mouth 2 (two) times daily. 60 tablet 0   [START ON 07/28/2021] amphetamine-dextroamphetamine (ADDERALL) 15 MG tablet Take 1 tablet by mouth 2 (two) times daily. 60 tablet 0   amphetamine-dextroamphetamine (ADDERALL) 15 MG tablet Take 1 tablet by mouth 2 (two) times daily. 60 tablet 0    cetirizine (ZYRTEC) 10 MG tablet Take 10 mg by mouth daily.     fluticasone (FLONASE) 50 MCG/ACT nasal spray SPRAY 2 SPRAYS INTO EACH NOSTRIL EVERY DAY 48 mL 2   sertraline (ZOLOFT) 100 MG tablet Take 1 tablet (100 mg total) by mouth daily. 90 tablet 1    Musculoskeletal: Strength & Muscle Tone: within normal limits Gait & Station: normal Patient leans: N/A            Psychiatric Specialty Exam:  Presentation  General Appearance:  No data recorded Eye Contact: No data recorded Speech: No data recorded Speech Volume: No data recorded Handedness: No data recorded  Mood and Affect  Mood: No data recorded Affect: No data recorded  Thought Process  Thought Processes: No data recorded Descriptions of Associations:No data recorded Orientation:No data recorded Thought Content:No data recorded History of Schizophrenia/Schizoaffective disorder:No data recorded Duration of Psychotic Symptoms:No data recorded Hallucinations:No data recorded Ideas of Reference:No data recorded Suicidal Thoughts:No data recorded Homicidal Thoughts:No data recorded  Sensorium  Memory: No data recorded Judgment: No data recorded Insight: No data recorded  Executive Functions  Concentration: No data recorded Attention Span: No data recorded Recall: No data recorded Fund of Knowledge: No data recorded Language: No data recorded  Psychomotor Activity  Psychomotor Activity: No data recorded  Assets  Assets: No data recorded  Sleep  Sleep: No data recorded  Physical Exam: Physical Exam Vitals and nursing note reviewed.  Constitutional:      Appearance: Normal appearance.  HENT:     Head: Normocephalic and atraumatic.     Mouth/Throat:     Pharynx: Oropharynx is clear.  Eyes:     Pupils: Pupils are equal, round, and reactive to light.  Cardiovascular:     Rate and Rhythm: Normal rate and regular rhythm.  Pulmonary:     Effort: Pulmonary effort is normal.      Breath sounds: Normal breath sounds.  Abdominal:     General: Abdomen is flat.     Palpations: Abdomen is soft.  Musculoskeletal:        General: Normal range of motion.  Skin:    General: Skin is warm and dry.  Neurological:     General: No focal deficit present.     Mental Status: He is alert. Mental status is at baseline.  Psychiatric:        Attention and Perception: Attention normal.        Mood and Affect: Mood is anxious.        Speech: Speech normal.        Behavior: Behavior is agitated. Behavior is not aggressive.        Thought Content: Thought content normal. Thought content is not paranoid or delusional. Thought content does not include homicidal or suicidal ideation.        Cognition and Memory: Cognition is impaired.  Judgment: Judgment is impulsive.   Review of Systems  Constitutional: Negative.   HENT: Negative.    Eyes: Negative.   Respiratory: Negative.    Cardiovascular: Negative.   Gastrointestinal: Negative.   Musculoskeletal: Negative.   Skin: Negative.   Neurological: Negative.   Psychiatric/Behavioral:  Negative for depression, hallucinations, substance abuse and suicidal ideas. The patient is nervous/anxious.   Blood pressure (!) 145/92, pulse 94, temperature 98.2 F (36.8 C), temperature source Oral, resp. rate 16, height 5\' 10"  (1.778 m), weight 90.7 kg, SpO2 99 %. Body mass index is 28.7 kg/m.  Treatment Plan Summary: Patient interviewed with TTS.  After completing the interview the patient does not appear to meet commitment criteria.  He repeatedly states he has no intention of hurting himself or anyone else.  He does not appear to be psychotic.  He admits that he came to the emergency room hoping to "jump start" somehow fixing this problem that he is having.  I advised him that I was not entirely sure how to categorize the problem.  Some of it sounds like a mood disorder possibly a type of bipolar, some of it sounds like undertreated ADHD,  some of it sounds possibly like overstimulation and it could also be withdrawal from the marijuana.  Because of this I do not feel comfortable making a change to his medication today.  Explained to the patient that he needs to see an outpatient mental health provider.  He is agreeable to this.  We will give him referral information including my having faxed a referral to the [facility] office.  No change to medicine for now.  Case reviewed with emergency room physician.   Disposition: No evidence of imminent risk to self or others at present.   Patient does not meet criteria for psychiatric inpatient admission. Supportive therapy provided about ongoing stressors. Discussed crisis plan, support from social network, calling 911, coming to the Emergency Department, and calling Suicide Hotline.  Mordecai RasmussenJohn Swathi Dauphin, MD 07/22/2021 3:14 PM

## 2021-07-22 NOTE — ED Provider Notes (Addendum)
[facility] ([facility])  ____________________________________________   None    (approximate)  I have reviewed the triage vital signs and the nursing notes.   HISTORY  Chief Complaint Psychiatric Evaluation    HPI Billy Acosta is a 30 y.o. male past medical history of ADHD, anxiety, GERD, hyperlipidemia who presents with difficulty controlling his emotions.  Patient tells me that he thinks that he has borderline personality disorder.  Tells me that he often feels like his emotions are up and down 1 time he would be feeling low and then the next second will be high and feels like people cannot keep up with him.  He is also been arguing with his wife and feels difficulty controlling his emotions.  He threw a chair through the door the other day because he was angry.  However he denies any frank suicidal or homicidal ideation.  He is taking Zoloft currently.  Does not follow with a psychiatrist or therapist regularly.  Patient had been smoking marijuana daily but quit last week.    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 Authorizing Provider  amphetamine-dextroamphetamine (ADDERALL) 15 MG tablet Take 1 tablet by mouth 2 (two)  times daily. 08/27/21   Glori Luis, MD  amphetamine-dextroamphetamine (ADDERALL) 15 MG tablet Take 1 tablet by mouth 2 (two) times daily. 07/28/21   Glori Luis, MD  amphetamine-dextroamphetamine (ADDERALL) 15 MG tablet Take 1 tablet by mouth 2 (two) times daily. 06/27/21   Glori Luis, MD  cetirizine (ZYRTEC) 10 MG tablet Take 10 mg by mouth daily.    [provider]  fluticasone (FLONASE) 50 MCG/ACT nasal spray SPRAY 2 SPRAYS INTO EACH NOSTRIL EVERY DAY 03/05/21   Glori Luis, MD  sertraline (ZOLOFT) 100 MG tablet Take 1 tablet (100 mg total) by mouth daily. 06/19/21   Glori Luis, MD    Allergies Iodine, Peanut-containing drug products, and Amoxicillin  Family History  Problem Relation Age of Onset   Arthritis Unknown        grandparent   Breast cancer Unknown        grandmother   Hyperlipidemia Unknown        parent and grandparent   Stroke Unknown        grandparent   Mental illness Unknown        parent, grandparent   Prostate cancer Neg Hx    Kidney cancer Neg Hx    Bladder Cancer Neg Hx     Social History Social History   Tobacco Use   Smoking status: Former    Types: E-cigarettes   Smokeless tobacco: Never  Substance Use Topics   Alcohol use: Yes    Alcohol/week: 0.0 standard drinks  Drug use: Yes    Types: Marijuana    Review of Systems   Review of Systems  Constitutional:  Negative for chills and fever.  Respiratory:  Negative for shortness of breath.   Cardiovascular:  Negative for chest pain.  Gastrointestinal:  Negative for abdominal pain, nausea and vomiting.  Psychiatric/Behavioral:  Positive for sleep disturbance. Negative for suicidal ideas. The patient is nervous/anxious.   All other systems reviewed and are negative.  Physical Exam Updated Vital Signs BP (!) 145/92   Pulse 94   Temp 98.2 F (36.8 C) (Oral)   Resp 16   Ht 5\' 10"  (1.778 m)   Wt 90.7 kg   SpO2 99%   BMI 28.70 kg/m   Physical  Exam Vitals and nursing note reviewed.  Constitutional:      General: He is not in acute distress.    Appearance: Normal appearance.  HENT:     Head: Normocephalic and atraumatic.  Eyes:     General: No scleral icterus.    Conjunctiva/sclera: Conjunctivae normal.  Pulmonary:     Effort: Pulmonary effort is normal. No respiratory distress.     Breath sounds: Normal breath sounds. No wheezing.  Musculoskeletal:        General: No deformity or signs of injury.     Cervical back: Normal range of motion.  Skin:    Coloration: Skin is not jaundiced or pale.  Neurological:     General: No focal deficit present.     Mental Status: He is alert and oriented to person, place, and time. Mental status is at baseline.  Psychiatric:     Comments: Intermittently tearful, has good insight, calm and cooperative, no SI or HI     LABS (all labs ordered are listed, but only abnormal results are displayed)  Labs Reviewed  COMPREHENSIVE METABOLIC PANEL - Abnormal; Notable for the following components:      Result Value   Glucose, Bld 161 (*)    All other components within normal limits  SALICYLATE LEVEL - Abnormal; Notable for the following components:   Salicylate Lvl <7.0 (*)    All other components within normal limits  ACETAMINOPHEN LEVEL - Abnormal; Notable for the following components:   Acetaminophen (Tylenol), Serum <10 (*)    All other components within normal limits  URINE DRUG SCREEN, QUALITATIVE ([facility] ONLY) - Abnormal; Notable for the following components:   Cannabinoid 50 Ng, Ur [place] POSITIVE (*)    All other components within normal limits  ETHANOL  CBC   ____________________________________________  EKG  N/a ____________________________________________  RADIOLOGY , personally viewed and evaluated these images (plain radiographs) as part of my medical decision making, as well as reviewing the written report by the radiologist.  ED MD interpretation:   n/a    ____________________________________________   PROCEDURES  Procedure(s) performed (including Critical Care):  Procedures   ____________________________________________   INITIAL IMPRESSION / ASSESSMENT AND PLAN / ED COURSE     30 year old male presenting with emotional difficulties.  He is tearful and would like to speak to a psychiatrist however he denies any suicidal or homicidal ideation.  Seems to me like he may have some underlying depression and mania at times.  He is not grossly psychotic at this time and has good insight into his condition.  Will consult psychiatry as they may have recommendations on outpatient treatment or medication adjustments for him.  Do not feel that he requires involuntary commitment at this time.  Patient seen  and cleared by psychiatry.  They will refer him to outpatient therapy.      ____________________________________________   FINAL CLINICAL IMPRESSION(S) / ED DIAGNOSES  Final diagnoses:  Emotional lability     ED Discharge Orders     None        Note:  This document was prepared using Dragon voice recognition software and may include unintentional dictation errors.    Georga Hacking, MD 07/22/21 1503    Georga Hacking, MD 07/22/21 [id number]

## 2021-07-22 NOTE — ED Notes (Signed)
Pt's wife visiting. Pt relations present.

## 2021-07-24 ENCOUNTER — Telehealth (HOSPITAL_COMMUNITY): Payer: Self-pay | Admitting: Licensed Clinical Social Worker

## 2021-07-25 NOTE — Telephone Encounter (Signed)
Spoke to patient and Spouse.  Informed them the referral is denied due to SUD and needs to have CDIOP before they can be seen in our offices.   They were very upset.  Gave them a few resources they could try.   Nothing Further Needed at this time.

## 2021-09-25 ENCOUNTER — Ambulatory Visit: Payer: 59 | Admitting: Family Medicine

## 2024-02-09 ENCOUNTER — Telehealth: Payer: Self-pay | Admitting: Family Medicine

## 2024-02-09 NOTE — Telephone Encounter (Signed)
 Dr Birdie Sons has lef the practice. If you wish to continue care at this office, please call and schedule a transfer of care to one of the following providers: Dr Charlann Lange, MD,  Darleen Crocker or Kara Dies, NP.  Thank you  E2C2 please schedule a TOC
